# Patient Record
Sex: Male | Born: 1955
Health system: Southern US, Community
[De-identification: ages and names within clinical notes are randomized; demographics above are authoritative.]

## PROBLEM LIST (undated history)

## (undated) DIAGNOSIS — R739 Hyperglycemia, unspecified: Secondary | ICD-10-CM

## (undated) DIAGNOSIS — R631 Polydipsia: Secondary | ICD-10-CM

## (undated) DIAGNOSIS — J449 Chronic obstructive pulmonary disease, unspecified: Secondary | ICD-10-CM

## (undated) DIAGNOSIS — R3589 Other polyuria: Secondary | ICD-10-CM

## (undated) DIAGNOSIS — R079 Chest pain, unspecified: Secondary | ICD-10-CM

## (undated) DIAGNOSIS — G8929 Other chronic pain: Secondary | ICD-10-CM

## (undated) DIAGNOSIS — R1110 Vomiting, unspecified: Secondary | ICD-10-CM

## (undated) DIAGNOSIS — R519 Headache, unspecified: Secondary | ICD-10-CM

## (undated) DIAGNOSIS — R51 Headache: Secondary | ICD-10-CM

## (undated) DIAGNOSIS — E119 Type 2 diabetes mellitus without complications: Secondary | ICD-10-CM

## (undated) DIAGNOSIS — R358 Other polyuria: Secondary | ICD-10-CM

## (undated) DIAGNOSIS — R42 Dizziness and giddiness: Secondary | ICD-10-CM

## (undated) HISTORY — DX: Headache, unspecified: R51.9

## (undated) HISTORY — DX: Chronic obstructive pulmonary disease, unspecified: J44.9

## (undated) HISTORY — DX: Vomiting, unspecified: R11.10

## (undated) HISTORY — DX: Other polyuria: R35.8

## (undated) HISTORY — DX: Other chronic pain: G89.29

## (undated) HISTORY — DX: Polydipsia: R63.1

## (undated) HISTORY — DX: Hyperglycemia, unspecified: R73.9

## (undated) HISTORY — DX: Headache: R51

## (undated) HISTORY — DX: Dizziness and giddiness: R42

## (undated) HISTORY — DX: Other polyuria: R35.89

## (undated) HISTORY — DX: Chest pain, unspecified: R07.9

## (undated) HISTORY — DX: Type 2 diabetes mellitus without complications: E11.9

---

## 2015-01-29 ENCOUNTER — Encounter (HOSPITAL_COMMUNITY): Payer: Self-pay | Admitting: Emergency Medicine

## 2015-01-29 ENCOUNTER — Emergency Department (HOSPITAL_COMMUNITY): Payer: Self-pay

## 2015-01-29 ENCOUNTER — Emergency Department (HOSPITAL_COMMUNITY)
Admission: EM | Admit: 2015-01-29 | Discharge: 2015-01-29 | Disposition: A | Discharge status: Home / Self Care | Payer: Self-pay | Attending: Emergency Medicine | Admitting: Emergency Medicine

## 2015-01-29 DIAGNOSIS — R1110 Vomiting, unspecified: Secondary | ICD-10-CM | POA: Insufficient documentation

## 2015-01-29 DIAGNOSIS — J40 Bronchitis, not specified as acute or chronic: Secondary | ICD-10-CM | POA: Insufficient documentation

## 2015-01-29 DIAGNOSIS — Z720 Tobacco use: Secondary | ICD-10-CM | POA: Insufficient documentation

## 2015-01-29 LAB — BASIC METABOLIC PANEL
Anion gap: 6 (ref 5–15)
BUN: 10 mg/dL (ref 6–20)
CHLORIDE: 107 mmol/L (ref 101–111)
CO2: 25 mmol/L (ref 22–32)
Calcium: 8.9 mg/dL (ref 8.9–10.3)
Creatinine, Ser: 0.92 mg/dL (ref 0.61–1.24)
GFR calc Af Amer: >60 mL/min (ref >60)
GFR calc non Af Amer: >60 mL/min (ref >60)
Glucose, Bld: 126 mg/dL — ABNORMAL HIGH (ref 70–99)
Potassium: 4.6 mmol/L (ref 3.5–5.1)
SODIUM: 138 mmol/L (ref 135–145)

## 2015-01-29 LAB — CBC
HCT: 41 % (ref 39.0–52.0)
Hemoglobin: 13.5 g/dL (ref 13.0–17.0)
MCH: 32.1 pg (ref 26.0–34.0)
MCHC: 32.9 g/dL (ref 30.0–36.0)
MCV: 97.4 fL (ref 78.0–100.0)
Platelets: 260 10*3/uL (ref 150–400)
RBC: 4.21 MIL/uL — AB (ref 4.22–5.81)
RDW: 12 % (ref 11.5–15.5)
WBC: 8.7 10*3/uL (ref 4.0–10.5)

## 2015-01-29 LAB — TROPONIN I

## 2015-01-29 LAB — BRAIN NATRIURETIC PEPTIDE: B NATRIURETIC PEPTIDE 5: 10.5 pg/mL (ref 0.0–100.0)

## 2015-01-29 MED ORDER — ALBUTEROL SULFATE HFA 108 (90 BASE) MCG/ACT IN AERS
2.0000 | INHALATION_SPRAY | Freq: Once | RESPIRATORY_TRACT | Status: AC
  Administered 2015-01-29: 2 via RESPIRATORY_TRACT
  Filled 2015-01-29: qty 6.7

## 2015-01-29 MED ORDER — ALBUTEROL (5 MG/ML) CONTINUOUS INHALATION SOLN
15.0000 mg/h | INHALATION_SOLUTION | Freq: Once | RESPIRATORY_TRACT | Status: AC
  Administered 2015-01-29: 15 mg/h via RESPIRATORY_TRACT
  Filled 2015-01-29: qty 20

## 2015-01-29 MED ORDER — PREDNISONE 20 MG PO TABS
60.0000 mg | ORAL_TABLET | Freq: Once | ORAL | Status: AC
  Administered 2015-01-29: 60 mg via ORAL
  Filled 2015-01-29: qty 3

## 2015-01-29 MED ORDER — IPRATROPIUM BROMIDE 0.02 % IN SOLN
1.0000 mg | Freq: Once | RESPIRATORY_TRACT | Status: AC
  Administered 2015-01-29: 1 mg via RESPIRATORY_TRACT
  Filled 2015-01-29: qty 5

## 2015-01-29 MED ORDER — ALBUTEROL SULFATE HFA 108 (90 BASE) MCG/ACT IN AERS: 2.0000 | INHALATION_SPRAY | RESPIRATORY_TRACT | Status: DC | PRN

## 2015-01-29 MED ORDER — PREDNISONE 20 MG PO TABS: 60.0000 mg | ORAL_TABLET | Freq: Every day | ORAL | Status: AC

## 2015-01-29 NOTE — ED Notes (Signed)
MD at bedside. 

## 2015-01-29 NOTE — ED Provider Notes (Signed)
CSN: [id number]     Arrival date & time 01/29/15  [id number] History   First MD Initiated Contact with Patient 01/29/15 1826     Chief Complaint  Patient presents with  . Chest Pain  . Shortness of Breath  . Cough     (Consider location/radiation/quality/duration/timing/severity/associated sxs/prior Treatment) HPI  59 year old male presents with chest pain, shortness breath, and cough over the last 2 days. It is a dry cough. Had a temperature of 100 yesterday. The patient's pain is constant and feels like a heaviness. Patient gets short of breath with minimal exertion. Patient chronically smokes daily but has not been able to smoke at all today due to shortness of breath. No known medical problems but patient has not been to a doctor in several years. The patient has never tried an albuterol inhaler or been told he has lung problems. One time had posttussive emesis. No leg swelling.  History reviewed. No pertinent past medical history. History reviewed. No pertinent past surgical history. History reviewed. No pertinent family history. History  Substance Use Topics  . Smoking status: Current Every Day Smoker  . Smokeless tobacco: Not on file  . Alcohol Use: No    Review of Systems  Constitutional: Positive for fever.  HENT: Positive for congestion (chest congestion).   Respiratory: Positive for cough and shortness of breath.   Cardiovascular: Positive for chest pain. Negative for leg swelling.  Gastrointestinal: Positive for vomiting.  All other systems reviewed and are negative.     Allergies  Review of patient's allergies indicates no known allergies.  Home Medications   Prior to Admission medications   Not on File   BP 153/95 mmHg  Pulse 75  Temp(Src) 98.7 F (37.1 C) (Oral)  Resp 26  SpO2 94% Physical Exam  Constitutional: He is oriented to person, place, and time. He appears well-developed and well-nourished. No distress.  HENT:  Head: Normocephalic and atraumatic.   Right Ear: External ear normal.  Left Ear: External ear normal.  Nose: Nose normal.  Eyes: Right eye exhibits no discharge. Left eye exhibits no discharge.  Neck: Neck supple.  Cardiovascular: Normal rate, regular rhythm, normal heart sounds and intact distal pulses.   Pulmonary/Chest: Effort normal. He has decreased breath sounds. He has wheezes. He exhibits tenderness.  Abdominal: Soft. He exhibits no distension. There is no tenderness.  Musculoskeletal: He exhibits no edema or tenderness.  Neurological: He is alert and oriented to person, place, and time.  Skin: Skin is warm and dry. He is not diaphoretic.  Nursing note and vitals reviewed.   ED Course  Procedures (including critical care time) Labs Review Labs Reviewed  CBC - Abnormal; Notable for the following:    RBC 4.21 (*)    All other components within normal limits  BASIC METABOLIC PANEL - Abnormal; Notable for the following:    Glucose, Bld 126 (*)    All other components within normal limits  BRAIN NATRIURETIC PEPTIDE  TROPONIN I    Imaging Review Dg Chest 2 View  01/29/2015   CLINICAL DATA:  Chest pain dyspnea and cough for 2 days.  EXAM: CHEST  2 VIEW  COMPARISON:  None.  FINDINGS: The heart size and mediastinal contours are within normal limits. Both lungs are clear. The visualized skeletal structures are unremarkable.  IMPRESSION: No active cardiopulmonary disease.   Electronically Signed   By: Ellery Plunkaniel R Mitchell M.D.   On: 01/29/2015 19:01     EKG Interpretation   Date/Time:  Sunday Jan 29 2015 18:29:55 EDT Ventricular Rate:  78 PR Interval:  142 QRS Duration: 101 QT Interval:  363 QTC Calculation: 413 R Axis:   79 Text Interpretation:  Sinus rhythm Baseline wander in lead(s) III V1 No  old tracing to compare Confirmed by JACUBOWITZ  MD, SAM [phone]) on  01/29/2015 6:33:41 PM      MDM   Final diagnoses:  Bronchitis    Patient was given a continuous albuterol nebulization given his significantly  decreased breath sounds and expiratory wheezes. I believe patient likely has undiagnosed COPD given his chronic smoking history. Chest x-ray is unremarkable. No hypoxia. After the albuterol and steroids the patient feels much better and has clear lungs. He was ambulated and his pulse ox remained above 93%. Minimal dyspnea while walking. Believe the patient is stable for discharge, he was given an inhaler in the ER and instructed on its use. Will also discharge with a steroid burst and make referral to a PCP in pulmonology for formal lung function testing.    Mark LovelessScott Odester Nilson, MD 01/30/15 0001

## 2015-01-29 NOTE — ED Notes (Signed)
RT notified that EDP wants continuous neb after xray has been completed

## 2015-01-29 NOTE — ED Notes (Signed)
Pt speaks spanish.  Grandson states that pt has been having gen chest pain, SOB, and cough x 2 days.  Some post tussive emesis.

## 2016-08-29 ENCOUNTER — Emergency Department (HOSPITAL_COMMUNITY): Payer: Self-pay

## 2016-08-29 ENCOUNTER — Encounter (HOSPITAL_COMMUNITY): Payer: Self-pay | Admitting: Emergency Medicine

## 2016-08-29 ENCOUNTER — Emergency Department (HOSPITAL_COMMUNITY)
Admission: EM | Admit: 2016-08-29 | Discharge: 2016-08-30 | Disposition: A | Discharge status: Home / Self Care | Payer: Self-pay | Attending: Emergency Medicine | Admitting: Emergency Medicine

## 2016-08-29 DIAGNOSIS — R55 Syncope and collapse: Secondary | ICD-10-CM | POA: Insufficient documentation

## 2016-08-29 DIAGNOSIS — F17200 Nicotine dependence, unspecified, uncomplicated: Secondary | ICD-10-CM | POA: Insufficient documentation

## 2016-08-29 DIAGNOSIS — R911 Solitary pulmonary nodule: Secondary | ICD-10-CM | POA: Insufficient documentation

## 2016-08-29 DIAGNOSIS — R059 Cough, unspecified: Secondary | ICD-10-CM

## 2016-08-29 DIAGNOSIS — R062 Wheezing: Secondary | ICD-10-CM | POA: Insufficient documentation

## 2016-08-29 DIAGNOSIS — D649 Anemia, unspecified: Secondary | ICD-10-CM | POA: Insufficient documentation

## 2016-08-29 DIAGNOSIS — R7309 Other abnormal glucose: Secondary | ICD-10-CM | POA: Insufficient documentation

## 2016-08-29 DIAGNOSIS — Z79899 Other long term (current) drug therapy: Secondary | ICD-10-CM | POA: Insufficient documentation

## 2016-08-29 DIAGNOSIS — R05 Cough: Secondary | ICD-10-CM | POA: Insufficient documentation

## 2016-08-29 LAB — URINALYSIS, ROUTINE W REFLEX MICROSCOPIC
Bilirubin Urine: NEGATIVE
Glucose, UA: NEGATIVE mg/dL
Hgb urine dipstick: NEGATIVE
Ketones, ur: NEGATIVE mg/dL
LEUKOCYTES UA: NEGATIVE
Nitrite: NEGATIVE
Protein, ur: NEGATIVE mg/dL
SPECIFIC GRAVITY, URINE: 1.017 (ref 1.005–1.030)
pH: 6 (ref 5.0–8.0)

## 2016-08-29 LAB — CBC
HCT: 37.1 % — ABNORMAL LOW (ref 39.0–52.0)
Hemoglobin: 12.3 g/dL — ABNORMAL LOW (ref 13.0–17.0)
MCH: 32.1 pg (ref 26.0–34.0)
MCHC: 33.2 g/dL (ref 30.0–36.0)
MCV: 96.9 fL (ref 78.0–100.0)
Platelets: 261 10*3/uL (ref 150–400)
RBC: 3.83 MIL/uL — AB (ref 4.22–5.81)
RDW: 12.5 % (ref 11.5–15.5)
WBC: 10.4 10*3/uL (ref 4.0–10.5)

## 2016-08-29 LAB — BRAIN NATRIURETIC PEPTIDE: B NATRIURETIC PEPTIDE 5: 12.8 pg/mL (ref 0.0–100.0)

## 2016-08-29 LAB — BASIC METABOLIC PANEL
Anion gap: 7 (ref 5–15)
BUN: 13 mg/dL (ref 6–20)
CALCIUM: 9.2 mg/dL (ref 8.9–10.3)
CO2: 27 mmol/L (ref 22–32)
Chloride: 105 mmol/L (ref 101–111)
Creatinine, Ser: 0.78 mg/dL (ref 0.61–1.24)
GFR calc non Af Amer: >60 mL/min (ref >60)
Glucose, Bld: 190 mg/dL — ABNORMAL HIGH (ref 65–99)
POTASSIUM: 3.9 mmol/L (ref 3.5–5.1)
Sodium: 139 mmol/L (ref 135–145)

## 2016-08-29 LAB — CBG MONITORING, ED: Glucose-Capillary: 167 mg/dL — ABNORMAL HIGH (ref 65–99)

## 2016-08-29 LAB — I-STAT TROPONIN, ED: TROPONIN I, POC: 0 ng/mL (ref 0.00–0.08)

## 2016-08-29 MED ORDER — IOPAMIDOL (ISOVUE-370) INJECTION 76%
INTRAVENOUS | Status: AC
  Filled 2016-08-29: qty 100

## 2016-08-29 MED ORDER — IOPAMIDOL (ISOVUE-370) INJECTION 76%
100.0000 mL | Freq: Once | INTRAVENOUS | Status: AC | PRN
Start: 2016-08-29 — End: 2016-08-29
  Administered 2016-08-29: 100 mL via INTRAVENOUS

## 2016-08-29 MED ORDER — IPRATROPIUM BROMIDE 0.02 % IN SOLN
0.5000 mg | Freq: Once | RESPIRATORY_TRACT | Status: AC
  Administered 2016-08-29: 0.5 mg via RESPIRATORY_TRACT
  Filled 2016-08-29: qty 2.5

## 2016-08-29 MED ORDER — ALBUTEROL SULFATE (2.5 MG/3ML) 0.083% IN NEBU
5.0000 mg | INHALATION_SOLUTION | Freq: Once | RESPIRATORY_TRACT | Status: AC
  Administered 2016-08-29: 5 mg via RESPIRATORY_TRACT
  Filled 2016-08-29: qty 6

## 2016-08-29 MED ORDER — SODIUM CHLORIDE 0.9 % IJ SOLN
INTRAMUSCULAR | Status: AC
  Filled 2016-08-29: qty 50

## 2016-08-29 NOTE — ED Provider Notes (Signed)
[facility]-EMERGENCY DEPT Provider Note    By signing my name below, I, Mark Curtis, attest that this documentation has been prepared under the direction and in the presence of [employer], PA-C. Electronically Signed: Earmon Curtis, ED Scribe. 08/30/16. 1:02 AM.    History   Chief Complaint Chief Complaint  Patient presents with  . Cough  . Loss of Consciousness   The history is provided by the patient and medical records. No language interpreter was used.    HPI Comments:  Mark Curtis is a 60 y.o. male who presents to the Emergency Department complaining of a progressively worsening productive cough of greenish sputum that began 8 days ago. He reports associated worsening SOB, chest congestion, chest tightness, intermittent palpitations and two post tussive syncopal events over the past two days when he ambulated to use the bathroom. He reports right elbow pain that began two days ago and posterior neck pain that began yesterday secondary to hitting these areas after the falls. He reports taking Mucinex, Motrin and an unspecified OTC cough medication with no significant relief of the symptoms. Exertion increases the SOB. He denies alleviating factors. He denies fever, chills, nausea, vomiting, numbness, tingling or weakness of any extremity, lower extremity swelling, CP. He reports being a smoker. 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 He denies any recent surgeries. He states he recently traveled to [place] in the past couple weeks but no other long distance travel. He denies h/o PE/DVT or cancer.   History reviewed. No pertinent past medical history.  There are no active problems to display for this patient.   History reviewed. No pertinent surgical history.     Home Medications    Prior to Admission medications   Medication Sig Start Date End Date Taking? Authorizing Provider    acetaminophen (TYLENOL) 500 MG tablet Take 500 mg by mouth every 6 (six) hours as needed for mild pain or moderate pain.   Yes Historical Provider, MD  albuterol (PROVENTIL HFA;VENTOLIN HFA) 108 (90 BASE) MCG/ACT inhaler Inhale 2 puffs into the lungs every 4 (four) hours as needed for wheezing or shortness of breath. 01/29/15  Yes Pricilla Loveless, MD  guaiFENesin (MUCINEX) 600 MG 12 hr tablet Take 600 mg by mouth 2 (two) times daily as needed for cough or to loosen phlegm.   Yes Historical Provider, MD  albuterol (PROVENTIL HFA;VENTOLIN HFA) 108 (90 Base) MCG/ACT inhaler Inhale 2 puffs into the lungs every 4 (four) hours as needed for wheezing or shortness of breath. 08/30/16   Charlesa Ehle, PA-C  azithromycin (ZITHROMAX) 250 MG tablet Take 1 tablet (250 mg total) by mouth daily. Take first 2 tablets together, then 1 every day until finished. 08/30/16   Julia Alkhatib, PA-C  predniSONE (DELTASONE) 20 MG tablet Take 2 tablets (40 mg total) by mouth daily. 08/30/16   Dahlia Client Trebor Galdamez, PA-C    Family History History reviewed. No pertinent family history.  Social History Social History  Substance Use Topics  . Smoking status: Current Every Day Smoker  . Smokeless tobacco: Not on file  . Alcohol use No     Allergies   Patient has no known allergies.   Review of Systems Review of Systems  Constitutional: Negative for chills and fever.  Respiratory: Positive for cough, chest tightness and shortness of breath.   Cardiovascular: Positive for palpitations. Negative for chest pain and leg swelling.  Gastrointestinal: Negative for nausea and vomiting.  Neurological: Positive  for syncope. Negative for weakness and numbness.  All other systems reviewed and are negative.    Physical Exam Updated Vital Signs BP 145/80 (BP Location: Left Arm)   Pulse 81   Resp 18   SpO2 98%   Physical Exam  Constitutional: He appears well-developed and well-nourished. No distress.  Awake, alert,  nontoxic appearance  HENT:  Head: Normocephalic and atraumatic.  Right Ear: Tympanic membrane, external ear and ear canal normal.  Left Ear: Tympanic membrane, external ear and ear canal normal.  Nose: Mucosal edema and rhinorrhea present. No epistaxis. Right sinus exhibits no maxillary sinus tenderness and no frontal sinus tenderness. Left sinus exhibits no maxillary sinus tenderness and no frontal sinus tenderness.  Mouth/Throat: Uvula is midline, oropharynx is clear and moist and mucous membranes are normal. Mucous membranes are not pale and not cyanotic. No oropharyngeal exudate, posterior oropharyngeal edema, posterior oropharyngeal erythema or tonsillar abscesses.  Eyes: Conjunctivae are normal. Pupils are equal, round, and reactive to light. No scleral icterus.  Neck: Normal range of motion and full passive range of motion without pain. Neck supple.  Midline cervical and paraspinal tenderness. Full ROM of cervical spine.  Cardiovascular: Normal rate, regular rhythm, normal heart sounds and intact distal pulses.   No murmur heard. Pulmonary/Chest: Accessory muscle usage ( mild) present. No stridor. Tachypnea noted. He has decreased breath sounds.  Diminished and equal breath sounds without focal wheezes, rhonchi, rales.  Abdominal: Soft. Bowel sounds are normal. He exhibits no mass. There is no tenderness. There is no rebound and no guarding.  Musculoskeletal: Normal range of motion. He exhibits tenderness. He exhibits no edema or deformity.  Full ROM of right elbow. Tenderness to palpation over the medial and lateral epicondyles of the right elbow. No midline thoracic or lumbar tenderness. No lower extremity edema.  Lymphadenopathy:    He has no cervical adenopathy.  Neurological: He is alert. No cranial nerve deficit.  Mental Status:  Alert, oriented, thought content appropriate, able to give a coherent history. Speech fluent without evidence of aphasia. Able to follow 2 step commands  without difficulty.  Cranial Nerves:  II:  Peripheral visual fields grossly normal, pupils equal, round, reactive to light III,IV, VI: ptosis not present, extra-ocular motions intact bilaterally  V,VII: smile symmetric, facial light touch sensation equal VIII: hearing grossly normal to voice  X: uvula elevates symmetrically  XI: bilateral shoulder shrug symmetric and strong XII: midline tongue extension without fassiculations Motor:  Normal tone. 5/5 in upper and lower extremities bilaterally including strong and equal grip strength and dorsiflexion/plantar flexion Sensory: Pinprick and light touch normal in all extremities.  Deep Tendon Reflexes: 2+ and symmetric in the biceps and patella Cerebellar: normal finger-to-nose with bilateral upper extremities Gait: normal gait and balance CV: distal pulses palpable throughout   Skin: Skin is warm and dry. No rash noted. He is not diaphoretic.  Psychiatric: He has a normal mood and affect.  Nursing note and vitals reviewed.    ED Treatments / Results   DIAGNOSTIC STUDIES: Oxygen Saturation is 98% on RA, normal by my interpretation.   COORDINATION OF CARE: 9:25 PM- Will order labs and CT of head, neck and chest. Will X-Ray right elbow. He reports some relief from the albuterol nebulizer he was given prior to exam. Will order another nebulizer treatment. Pt verbalizes understanding and agrees to plan.  Medications  sodium chloride 0.9 % injection (not administered)  iopamidol (ISOVUE-370) 76 % injection (not administered)  albuterol (PROVENTIL HFA;VENTOLIN HFA) 108 (  90 Base) MCG/ACT inhaler (  Not Given 08/30/16 0131)  albuterol (PROVENTIL) (2.5 MG/3ML) 0.083% nebulizer solution 5 mg (5 mg Nebulization Given 08/29/16 1833)  albuterol (PROVENTIL) (2.5 MG/3ML) 0.083% nebulizer solution 5 mg (5 mg Nebulization Given 08/29/16 2152)  ipratropium (ATROVENT) nebulizer solution 0.5 mg (0.5 mg Nebulization Given 08/29/16 2152)  iopamidol  (ISOVUE-370) 76 % injection 100 mL (100 mLs Intravenous Contrast Given 08/29/16 2307)  albuterol (PROVENTIL) (2.5 MG/3ML) 0.083% nebulizer solution 5 mg (5 mg Nebulization Given 08/30/16 0106)  ipratropium (ATROVENT) nebulizer solution 0.5 mg (0.5 mg Nebulization Given 08/30/16 0106)  albuterol (PROVENTIL HFA;VENTOLIN HFA) 108 (90 Base) MCG/ACT inhaler 2 puff (2 puffs Inhalation Given 08/30/16 0132)  methylPREDNISolone sodium succinate (SOLU-MEDROL) 125 mg/2 mL injection 125 mg (125 mg Intravenous Given 08/30/16 0151)   Labs (all labs ordered are listed, but only abnormal results are displayed) Labs Reviewed  BASIC METABOLIC PANEL - Abnormal; Notable for the following:       Result Value   Glucose, Bld 190 (*)    All other components within normal limits  CBC - Abnormal; Notable for the following:    RBC 3.83 (*)    Hemoglobin 12.3 (*)    HCT 37.1 (*)    All other components within normal limits  CBG MONITORING, ED - Abnormal; Notable for the following:    Glucose-Capillary 167 (*)    All other components within normal limits  URINALYSIS, ROUTINE W REFLEX MICROSCOPIC  BRAIN NATRIURETIC PEPTIDE  I-STAT TROPOININ, ED    EKG  EKG Interpretation  Date/Time:  Thursday August 29 2016 18:33:03 EST Ventricular Rate:  75 PR Interval:    QRS Duration: 103 QT Interval:  380 QTC Calculation: 425 R Axis:   83 Text Interpretation:  Sinus rhythm No significant change since last tracing Confirmed by [facility], PEDRO ([id number]) on 08/29/2016 11:48:23 PM       Radiology Dg Chest 2 View  Result Date: 08/29/2016 CLINICAL DATA:  60 year old male progressive cough congestion wheezing for 1 week. Syncope last night. Initial encounter. EXAM: CHEST  2 VIEW COMPARISON:  01/29/2015 chest radiographs. FINDINGS: Larger lung volumes but still within normal limits. Normal cardiac size and mediastinal contours. Visualized tracheal air column is within normal limits. No pneumothorax, pulmonary edema, pleural  effusion or confluent pulmonary opacity. No acute osseous abnormality identified. Negative visible bowel gas pattern. IMPRESSION: No acute cardiopulmonary abnormality. Electronically Signed   By: Odessa FlemingH  Hall M.D.   On: 08/29/2016 19:04   Ct Head Wo Contrast  Result Date: 08/29/2016 CLINICAL DATA:  Progressive cough and congestion with wheezing. Coughing and cough syncopal episodes x1 week. EXAM: CT HEAD WITHOUT CONTRAST CT CERVICAL SPINE WITHOUT CONTRAST TECHNIQUE: Multidetector CT imaging of the head and cervical spine was performed following the standard protocol without intravenous contrast. Multiplanar CT image reconstructions of the cervical spine were also generated. COMPARISON:  None. FINDINGS: CT HEAD FINDINGS Brain: No evidence of acute infarction, hemorrhage, hydrocephalus, extra-axial collection or mass lesion/mass effect. Vascular: No hyperdense vessel or unexpected calcification. Skull: Normal. Negative for fracture or focal lesion. Sinuses/Orbits: There is moderate ethmoid sinus mucosal thickening polypoid mucosal thickening in the maxillary sinuses. No air-fluid levels are noted. Mild circumferential mucosal thickening of the sphenoid sinus. The frontal sinus appears clear. Visualized orbits demonstrate no acute abnormality. Other: None. CT CERVICAL SPINE FINDINGS Alignment: Craniocervical relationship is maintained. 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 There is slight reversal cervical lordosis which may be due  to spasm or positioning. Skull base and vertebrae: No acute fracture of the vertebral bodies for visualized skull base. Patchy due to degenerative subcortical lucencies are noted about the vertebral bodies from C2 through C6. Soft tissues and spinal canal: No prevertebral soft tissue swelling is noted. No intraspinal hematoma or significant spinal stenosis. Disc levels: There is degenerative disc space narrowing with small  posterior marginal osteophytes at C5-6 contributing to mild bilateral neural foraminal encroachment. The facets are maintained and aligned bilaterally. There is mild facet hypertrophy on the left at C4-5. Upper chest: Negative Other: None IMPRESSION: Chronic appearing and ethmoid, sphenoid and maxillary sinusitis. No acute intracranial abnormality. Degenerative disc disease most prominently affecting C5-6 with bilateral mild neural foraminal encroachment. The Electronically Signed   By: Tollie Ethavid  Kwon M.D.   On: 08/29/2016 23:53   Ct Angio Chest Pe W Or Wo Contrast  Result Date: 08/30/2016 CLINICAL DATA:  Fall, syncope. Cough and shortness of breath. Dyspnea on exertion. EXAM: CT ANGIOGRAPHY CHEST WITH CONTRAST TECHNIQUE: Multidetector CT imaging of the chest was performed using the standard protocol during bolus administration of intravenous contrast. Multiplanar CT image reconstructions and MIPs were obtained to evaluate the vascular anatomy. CONTRAST:  100 cc Isovue 370 IV COMPARISON:  Radiographs earlier this day FINDINGS: Cardiovascular: There are no filling defects within the pulmonary arteries to suggest pulmonary embolus. No aortic dissection. Minimal atherosclerosis of the aortic arch. Normal heart size. No pericardial fluid. Mediastinum/Nodes: Prominent right hilar node measures 10 mm short axis. There are small lower paratracheal lymph nodes. No axillary adenopathy. Visualized thyroid gland is normal. Esophagus mildly patulous. 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 Lungs/Pleura: Mild motion artifact through the lower lobes. Tiny 4 mm nodule in the right middle lobe image 57 series 14. Linear subsegmental atelectasis in the lingula. No findings of pulmonary edema or focal airspace disease. No pleural fluid. Upper Abdomen: Hepatic steatosis suspected.  No acute abnormality. Musculoskeletal: There are no acute or suspicious osseous abnormalities. Bone island in mid thoracic vertebra.  Review of the MIP images confirms the above findings. IMPRESSION: 1. No pulmonary embolus. 2. Prominent right hilar lymph node may be reactive but is nonspecific. 3. Tiny 4 mm right middle lobe nodule. No follow-up needed if patient is low-risk. Non-contrast chest CT can be considered in 12 months if patient is high-risk. This recommendation follows the consensus statement: Guidelines for Management of Incidental Pulmonary Nodules Detected on CT Images: From the Fleischner Society 2017; Radiology 2017; 284:228-243. Electronically Signed   By: Rubye OaksMelanie  Ehinger M.D.   On: 08/30/2016 00:00   Ct Cervical Spine Wo Contrast  Result Date: 08/29/2016 CLINICAL DATA:  Progressive cough and congestion with wheezing. Coughing and cough syncopal episodes x1 week. EXAM: CT HEAD WITHOUT CONTRAST CT CERVICAL SPINE WITHOUT CONTRAST TECHNIQUE: Multidetector CT imaging of the head and cervical spine was performed following the standard protocol without intravenous contrast. Multiplanar CT image reconstructions of the cervical spine were also generated. COMPARISON:  None. FINDINGS: CT HEAD FINDINGS Brain: No evidence of acute infarction, hemorrhage, hydrocephalus, extra-axial collection or mass lesion/mass effect. Vascular: No hyperdense vessel or unexpected calcification. Skull: Normal. Negative for fracture or focal lesion. Sinuses/Orbits: There is moderate ethmoid sinus mucosal thickening polypoid mucosal thickening in the maxillary sinuses. No air-fluid levels are noted. Mild circumferential mucosal thickening of the sphenoid sinus. The frontal sinus appears clear. Visualized orbits demonstrate no acute abnormality. Other: None. CT CERVICAL SPINE FINDINGS Alignment: Craniocervical relationship is maintained. There is osteoarthritis about the  atlantodental interval with joint space narrowing, spurring and sclerosis. There is slight reversal cervical lordosis which may be due to spasm or positioning. Skull base and vertebrae: No  acute fracture of the vertebral bodies for visualized skull base. Patchy due to degenerative subcortical lucencies are noted about the vertebral bodies from C2 through C6. Soft tissues and spinal canal: No prevertebral soft tissue swelling is noted. No intraspinal hematoma or significant spinal stenosis. Disc levels: There is degenerative disc space narrowing with small posterior marginal osteophytes at C5-6 contributing to mild bilateral neural foraminal encroachment. The facets are maintained and aligned bilaterally. There is mild facet hypertrophy on the left at C4-5. Upper chest: Negative Other: None IMPRESSION: Chronic appearing and ethmoid, sphenoid and maxillary sinusitis. No acute intracranial abnormality. Degenerative disc disease most prominently affecting C5-6 with bilateral mild neural foraminal encroachment. The Electronically Signed   By: Tollie Eth M.D.   On: 08/29/2016 23:53    Procedures Procedures (including critical care time)  Medications Ordered in ED Medications  sodium chloride 0.9 % injection (not administered)  iopamidol (ISOVUE-370) 76 % injection (not administered)  albuterol (PROVENTIL HFA;VENTOLIN HFA) 108 (90 Base) MCG/ACT inhaler (  Not Given 08/30/16 0131)  albuterol (PROVENTIL) (2.5 MG/3ML) 0.083% nebulizer solution 5 mg (5 mg Nebulization Given 08/29/16 1833)  albuterol (PROVENTIL) (2.5 MG/3ML) 0.083% nebulizer solution 5 mg (5 mg Nebulization Given 08/29/16 2152)  ipratropium (ATROVENT) nebulizer solution 0.5 mg (0.5 mg Nebulization Given 08/29/16 2152)  iopamidol (ISOVUE-370) 76 % injection 100 mL (100 mLs Intravenous Contrast Given 08/29/16 2307)  albuterol (PROVENTIL) (2.5 MG/3ML) 0.083% nebulizer solution 5 mg (5 mg Nebulization Given 08/30/16 0106)  ipratropium (ATROVENT) nebulizer solution 0.5 mg (0.5 mg Nebulization Given 08/30/16 0106)  albuterol (PROVENTIL HFA;VENTOLIN HFA) 108 (90 Base) MCG/ACT inhaler 2 puff (2 puffs Inhalation Given 08/30/16 0132)    methylPREDNISolone sodium succinate (SOLU-MEDROL) 125 mg/2 mL injection 125 mg (125 mg Intravenous Given 08/30/16 0151)     Initial Impression / Assessment and Plan / ED Course  I have reviewed the triage vital signs and the nursing notes.  Pertinent labs & imaging results that were available during my care of the patient were reviewed by me and considered in my medical decision making (see chart for details).  Clinical Course    Patient presents with 8 days of shortness of breath with dyspnea on exertion. No evidence of heart failure. EKG with normal sinus rhythm, troponin negative and BNP within normal limits. Urinalysis without evidence of infection.  Elevated glucose noted however patient ate shortly before arrival. Mild anemia noted.  CT without evidence of pneumonia or pulmonary embolism. Lung nodule is noted. On discussion with patient and family about need for follow-up for lung nodule. Patient was given 3 albuterol treatments along with Solu-Medrol with significant improvement in his respiratory status. His tachypnea resolved. No additional accessory muscle usage or distress. He ambulates without hypoxia or dyspnea. Patient is a long-term smoker and suspect some component of COPD. Will discharge home with symptomatic treatment.  Discussed with patient and family the importance of close PCP follow-up for mild anemia, slightly elevated glucose and lung nodule. Patient given resources for wellness Center. Patient discharged with albuterol MDI.  Patient without syncope in the emergency department. No arrhythmia noted. History is consistent with vasovagal syncope secondary to severe bouts of coughing.  Neurologic exam is nonfocal.  History and physical are not consistent with TIA. Doubt cardiac arrhythmia. Patient is to return to the emergency department if syncope returns.  I personally performed the services described in this documentation, which was scribed in my presence. The recorded  information has been reviewed and is accurate.   Final Clinical Impressions(s) / ED Diagnoses   Final diagnoses:  Cough  Wheezing  Pulmonary nodule  Syncope, unspecified syncope type  Anemia, unspecified type  Elevated glucose    New Prescriptions Discharge Medication List as of 08/30/2016  1:36 AM    START taking these medications   Details  azithromycin (ZITHROMAX) 250 MG tablet Take 1 tablet (250 mg total) by mouth daily. Take first 2 tablets together, then 1 every day until finished., Starting Fri 08/30/2016, Print    predniSONE (DELTASONE) 20 MG tablet Take 2 tablets (40 mg total) by mouth daily., Starting Fri 08/30/2016, Print         Stafford Riviera, PA-C 08/30/16 0214    Nira Conn, MD 08/30/16 [phone]

## 2016-08-29 NOTE — ED Triage Notes (Signed)
Pt c/o severe progressive cough and congestion, wheezing, coughing has caused syncopal episodes x 1 week. Self-treating with OTC mucinex without relief. Last syncopal episode last night.

## 2016-08-29 NOTE — ED Notes (Signed)
Pt informed urine needed for UA unable to provide at this time. Will check back.

## 2016-08-30 MED ORDER — PREDNISONE 20 MG PO TABS: 40.0000 mg | ORAL_TABLET | Freq: Every day | ORAL | 0 refills | Status: DC

## 2016-08-30 MED ORDER — ALBUTEROL SULFATE HFA 108 (90 BASE) MCG/ACT IN AERS
INHALATION_SPRAY | RESPIRATORY_TRACT | Status: AC
  Filled 2016-08-30: qty 6.7

## 2016-08-30 MED ORDER — METHYLPREDNISOLONE SODIUM SUCC 125 MG IJ SOLR
125.0000 mg | Freq: Once | INTRAMUSCULAR | Status: AC
  Administered 2016-08-30: 125 mg via INTRAVENOUS
  Filled 2016-08-30: qty 2

## 2016-08-30 MED ORDER — ALBUTEROL SULFATE HFA 108 (90 BASE) MCG/ACT IN AERS: 2.0000 | INHALATION_SPRAY | RESPIRATORY_TRACT | 3 refills | Status: DC | PRN

## 2016-08-30 MED ORDER — IPRATROPIUM BROMIDE 0.02 % IN SOLN
0.5000 mg | Freq: Once | RESPIRATORY_TRACT | Status: AC
  Administered 2016-08-30: 0.5 mg via RESPIRATORY_TRACT
  Filled 2016-08-30: qty 2.5

## 2016-08-30 MED ORDER — ALBUTEROL SULFATE (2.5 MG/3ML) 0.083% IN NEBU
5.0000 mg | INHALATION_SOLUTION | Freq: Once | RESPIRATORY_TRACT | Status: AC
  Administered 2016-08-30: 5 mg via RESPIRATORY_TRACT
  Filled 2016-08-30: qty 6

## 2016-08-30 MED ORDER — ALBUTEROL SULFATE HFA 108 (90 BASE) MCG/ACT IN AERS
2.0000 | INHALATION_SPRAY | Freq: Once | RESPIRATORY_TRACT | Status: AC
  Administered 2016-08-30: 2 via RESPIRATORY_TRACT

## 2016-08-30 MED ORDER — AZITHROMYCIN 250 MG PO TABS: 250.0000 mg | ORAL_TABLET | Freq: Every day | ORAL | 0 refills | Status: DC

## 2016-08-30 NOTE — ED Notes (Signed)
Ambulated patient and he did well. O2 ranged from 94-97.

## 2016-08-30 NOTE — Discharge Instructions (Signed)
1. Medications: albuterol, prednisone, azithromycin, usual home medications °2. Treatment: rest, drink plenty of fluids, begin OTC antihistamine (Zyrtec or Claritin)  °3. Follow Up: Please followup with your primary doctor in 2-3 days for discussion of your diagnoses and further evaluation after today's visit; if you do not have a primary care doctor use the resource guide provided to find one; Please return to the ER for difficulty breathing, high fevers or worsening symptoms. ° °

## 2017-06-24 ENCOUNTER — Emergency Department (HOSPITAL_COMMUNITY): Payer: Self-pay

## 2017-06-24 ENCOUNTER — Emergency Department (HOSPITAL_COMMUNITY)
Admission: EM | Admit: 2017-06-24 | Discharge: 2017-06-24 | Disposition: A | Discharge status: Home / Self Care | Payer: Self-pay | Attending: Emergency Medicine | Admitting: Emergency Medicine

## 2017-06-24 ENCOUNTER — Encounter (HOSPITAL_COMMUNITY): Payer: Self-pay | Admitting: Emergency Medicine

## 2017-06-24 DIAGNOSIS — R079 Chest pain, unspecified: Secondary | ICD-10-CM | POA: Insufficient documentation

## 2017-06-24 DIAGNOSIS — Z87891 Personal history of nicotine dependence: Secondary | ICD-10-CM | POA: Insufficient documentation

## 2017-06-24 DIAGNOSIS — R739 Hyperglycemia, unspecified: Secondary | ICD-10-CM | POA: Insufficient documentation

## 2017-06-24 DIAGNOSIS — Z7982 Long term (current) use of aspirin: Secondary | ICD-10-CM | POA: Insufficient documentation

## 2017-06-24 DIAGNOSIS — E119 Type 2 diabetes mellitus without complications: Secondary | ICD-10-CM | POA: Insufficient documentation

## 2017-06-24 DIAGNOSIS — Z7984 Long term (current) use of oral hypoglycemic drugs: Secondary | ICD-10-CM | POA: Insufficient documentation

## 2017-06-24 DIAGNOSIS — R631 Polydipsia: Secondary | ICD-10-CM | POA: Insufficient documentation

## 2017-06-24 LAB — BLOOD GAS, VENOUS
Acid-Base Excess: 2.5 mmol/L — ABNORMAL HIGH (ref 0.0–2.0)
BICARBONATE: 28.2 mmol/L — AB (ref 20.0–28.0)
O2 Saturation: 67.7 %
PATIENT TEMPERATURE: 98.6
PH VEN: 7.372 (ref 7.250–7.430)
pCO2, Ven: 49.8 mmHg (ref 44.0–60.0)
pO2, Ven: 37.5 mmHg (ref 32.0–45.0)

## 2017-06-24 LAB — BASIC METABOLIC PANEL
Anion gap: 10 (ref 5–15)
BUN: 11 mg/dL (ref 6–20)
CHLORIDE: 99 mmol/L — AB (ref 101–111)
CO2: 26 mmol/L (ref 22–32)
CREATININE: 0.78 mg/dL (ref 0.61–1.24)
Calcium: 9.5 mg/dL (ref 8.9–10.3)
GFR calc Af Amer: >60 mL/min (ref >60)
GFR calc non Af Amer: >60 mL/min (ref >60)
Glucose, Bld: 417 mg/dL — ABNORMAL HIGH (ref 65–99)
Potassium: 4.3 mmol/L (ref 3.5–5.1)
Sodium: 135 mmol/L (ref 135–145)

## 2017-06-24 LAB — URINALYSIS, ROUTINE W REFLEX MICROSCOPIC
Bacteria, UA: NONE SEEN
Bilirubin Urine: NEGATIVE
Glucose, UA: >=500 mg/dL — AB
Hgb urine dipstick: NEGATIVE
Ketones, ur: 5 mg/dL — AB
Leukocytes, UA: NEGATIVE
Nitrite: NEGATIVE
PH: 6 (ref 5.0–8.0)
Protein, ur: NEGATIVE mg/dL
SQUAMOUS EPITHELIAL / LPF: NONE SEEN
Specific Gravity, Urine: 1.033 — ABNORMAL HIGH (ref 1.005–1.030)

## 2017-06-24 LAB — CBC
HCT: 44 % (ref 39.0–52.0)
Hemoglobin: 15.5 g/dL (ref 13.0–17.0)
MCH: 32.5 pg (ref 26.0–34.0)
MCHC: 35.2 g/dL (ref 30.0–36.0)
MCV: 92.2 fL (ref 78.0–100.0)
PLATELETS: 201 10*3/uL (ref 150–400)
RBC: 4.77 MIL/uL (ref 4.22–5.81)
RDW: 11.8 % (ref 11.5–15.5)
WBC: 10.1 10*3/uL (ref 4.0–10.5)

## 2017-06-24 LAB — CBG MONITORING, ED
GLUCOSE-CAPILLARY: 276 mg/dL — AB (ref 65–99)
Glucose-Capillary: 424 mg/dL — ABNORMAL HIGH (ref 65–99)
Glucose-Capillary: 291 mg/dL — ABNORMAL HIGH (ref 65–99)

## 2017-06-24 LAB — TROPONIN I

## 2017-06-24 MED ORDER — METFORMIN HCL 500 MG PO TABS: 500.0000 mg | ORAL_TABLET | Freq: Two times a day (BID) | ORAL | 0 refills | Status: DC

## 2017-06-24 MED ORDER — SODIUM CHLORIDE 0.9 % IV BOLUS (SEPSIS)
1000.0000 mL | Freq: Once | INTRAVENOUS | Status: AC
Start: 2017-06-24 — End: 2017-06-24
  Administered 2017-06-24: 1000 mL via INTRAVENOUS

## 2017-06-24 MED ORDER — SODIUM CHLORIDE 0.9 % IV BOLUS (SEPSIS)
1000.0000 mL | Freq: Once | INTRAVENOUS | Status: AC
  Administered 2017-06-24: 1000 mL via INTRAVENOUS

## 2017-06-24 MED ORDER — METFORMIN HCL 500 MG PO TABS
500.0000 mg | ORAL_TABLET | Freq: Once | ORAL | Status: AC
  Administered 2017-06-24: 500 mg via ORAL
  Filled 2017-06-24: qty 1

## 2017-06-24 MED ORDER — ASPIRIN EC 81 MG PO TBEC: 81.0000 mg | DELAYED_RELEASE_TABLET | Freq: Every day | ORAL | 0 refills | Status: DC

## 2017-06-24 NOTE — ED Triage Notes (Signed)
Patient brought in by son, speaks no [place]. Son reports that patient has been complaining of increase urination and excessive thirst over the past few months. Dizziness. CBG 464 in triage.

## 2017-06-24 NOTE — ED Provider Notes (Signed)
[facility]-EMERGENCY DEPT Provider Note   CSN: [id number] Arrival date & time: 06/24/17  1028     History   Chief Complaint Chief Complaint  Patient presents with  . Polyuria  . Polydipsia    HPI Mark Curtis is a 61 y.o. male.  HPI  61 year old male who smokes but denies any other past medical history presents with polyuria and polydipsia. History is taken without but his son who interprets. This is been going on for at least a week or 2 or he has had dry mouth and frequent thirst. He has been drinking a lot. He's been urinating a lot. Over the past 2 mornings he's vomited once each in the morning. No reported history of diabetes. The patient has also had some chest pain when asked on review of systems. It feels a Squeezing. This is also been for the past 2 weeks, mostly when exerting himself at work or in the heat. He gets a headache at the same time. None of the symptoms are present currently.  History reviewed. No pertinent past medical history.  There are no active problems to display for this patient.   History reviewed. No pertinent surgical history.     Home Medications    Prior to Admission medications   Medication Sig Start Date End Date Taking? Authorizing Provider  albuterol (PROVENTIL HFA;VENTOLIN HFA) 108 (90 Base) MCG/ACT inhaler Inhale 2 puffs into the lungs every 4 (four) hours as needed for wheezing or shortness of breath. 08/30/16  Yes Muthersbaugh, Boyd Kerbs  aspirin EC 81 MG tablet Take 1 tablet (81 mg total) by mouth daily. 06/24/17   Pricilla Loveless, MD  metFORMIN (GLUCOPHAGE) 500 MG tablet Take 1 tablet (500 mg total) by mouth 2 (two) times daily with a meal. 06/24/17   Pricilla Loveless, MD    Family History No family history on file.  Social History Social History  Substance Use Topics  . Smoking status: Current Every Day Smoker  . Smokeless tobacco: Never Used  . Alcohol use No     Allergies   Patient has no known allergies.   Review of  Systems Review of Systems  Constitutional: Negative for fever.  Cardiovascular: Positive for chest pain.  Gastrointestinal: Positive for vomiting. Negative for abdominal pain.  Endocrine: Positive for polydipsia and polyuria.  Neurological: Positive for dizziness (when standing) and headaches. Negative for weakness and numbness.  All other systems reviewed and are negative.    Physical Exam Updated Vital Signs BP (!) 118/93 (BP Location: Right Arm)   Pulse 71   Temp 98.5 F (36.9 C)   Resp 16   SpO2 99%   Physical Exam  Constitutional: He is oriented to person, place, and time. He appears well-developed and well-nourished. No distress.  HENT:  Head: Normocephalic and atraumatic.  Right Ear: External ear normal.  Left Ear: External ear normal.  Nose: Nose normal.  Mouth/Throat: Oropharynx is clear and moist. No oropharyngeal exudate.  Eyes: Pupils are equal, round, and reactive to light. EOM are normal. Right eye exhibits no discharge. Left eye exhibits no discharge.  Neck: Normal range of motion. Neck supple.  Cardiovascular: Normal rate, regular rhythm and normal heart sounds.   Pulmonary/Chest: Effort normal and breath sounds normal.  Abdominal: Soft. There is no tenderness.  Musculoskeletal: He exhibits no edema.  Neurological: He is alert and oriented to person, place, and time.  CN 3-12 grossly intact. 5/5 strength in all 4 extremities. Grossly normal sensation. Normal finger to nose.  Skin: Skin is warm and dry. He is not diaphoretic.  Nursing note and vitals reviewed.    ED Treatments / Results  Labs (all labs ordered are listed, but only abnormal results are displayed) Labs Reviewed  BASIC METABOLIC PANEL - Abnormal; Notable for the following:       Result Value   Chloride 99 (*)    Glucose, Bld 417 (*)    All other components within normal limits  URINALYSIS, ROUTINE W REFLEX MICROSCOPIC - Abnormal; Notable for the following:    Specific Gravity, Urine  1.033 (*)    Glucose, UA >=500 (*)    Ketones, ur 5 (*)    All other components within normal limits  BLOOD GAS, VENOUS - Abnormal; Notable for the following:    Bicarbonate 28.2 (*)    Acid-Base Excess 2.5 (*)    All other components within normal limits  CBG MONITORING, ED - Abnormal; Notable for the following:    Glucose-Capillary 424 (*)    All other components within normal limits  CBG MONITORING, ED - Abnormal; Notable for the following:    Glucose-Capillary 276 (*)    All other components within normal limits  CBG MONITORING, ED - Abnormal; Notable for the following:    Glucose-Capillary 291 (*)    All other components within normal limits  CBC  TROPONIN I    EKG  EKG Interpretation  Date/Time:  Tuesday June 24 2017 11:55:22 EDT Ventricular Rate:  74 PR Interval:    QRS Duration: 100 QT Interval:  378 QTC Calculation: 420 R Axis:   86 Text Interpretation:  Sinus rhythm Borderline right axis deviation Low voltage, precordial leads Abnormal inferior Q waves no significant change since Dec 2017 Confirmed by Pricilla Loveless [phone]) on 06/24/2017 12:08:00 PM       Radiology Dg Chest 2 View  Result Date: 06/24/2017 CLINICAL DATA:  Chest pain EXAM: CHEST  2 VIEW COMPARISON:  08/29/2016 FINDINGS: Cardiac shadows within normal limits. The lungs are well aerated bilaterally. No focal infiltrate or sizable effusion is seen. No acute bony abnormality is noted. IMPRESSION: No active cardiopulmonary disease. Electronically Signed   By: Alcide Clever M.D.   On: 06/24/2017 12:29    Procedures Procedures (including critical care time)  Medications Ordered in ED Medications  sodium chloride 0.9 % bolus 1,000 mL (0 mLs Intravenous Stopped 06/24/17 1355)  sodium chloride 0.9 % bolus 1,000 mL (0 mLs Intravenous Stopped 06/24/17 1355)  metFORMIN (GLUCOPHAGE) tablet 500 mg (500 mg Oral Given 06/24/17 1356)     Initial Impression / Assessment and Plan / ED Course  I have reviewed  the triage vital signs and the nursing notes.  Pertinent labs & imaging results that were available during my care of the patient were reviewed by me and considered in my medical decision making (see chart for details).     Patient's workup is consistent with hyperglycemia from new onset diabetes. He does have chest pain on and off, mostly with exertion for the past 2 weeks. However no chest pain now. ECG similar to prior with no ischemic changes. Troponin negative. This could be anginal, I will refer him to cardiology and ask him to start a baby aspirin. As for his diabetes, he was given IV fluids and feels much better. Started on metformin. I have discussed the importance of a PCP and diet/exercise changes. He and family seem to understand. Discussed return precautions.  Final Clinical Impressions(s) / ED Diagnoses   Final diagnoses:  New onset type 2 diabetes mellitus (HCC)  Hyperglycemia  Chest pain on exertion    New Prescriptions Discharge Medication List as of 06/24/2017  2:09 PM    START taking these medications   Details  aspirin EC 81 MG tablet Take 1 tablet (81 mg total) by mouth daily., Starting Tue 06/24/2017, Print    metFORMIN (GLUCOPHAGE) 500 MG tablet Take 1 tablet (500 mg total) by mouth 2 (two) times daily with a meal., Starting Tue 06/24/2017, Print         Pricilla Loveless, MD 06/24/17 [phone]

## 2017-07-11 ENCOUNTER — Ambulatory Visit: Payer: Self-pay | Attending: Internal Medicine | Admitting: Physician Assistant

## 2017-07-11 VITALS — BP 128/82 | HR 75 | Temp 97.6°F | Resp 18 | Ht 71.0 in | Wt 194.4 lb

## 2017-07-11 DIAGNOSIS — E138 Other specified diabetes mellitus with unspecified complications: Secondary | ICD-10-CM

## 2017-07-11 DIAGNOSIS — R358 Other polyuria: Secondary | ICD-10-CM | POA: Insufficient documentation

## 2017-07-11 DIAGNOSIS — Z7982 Long term (current) use of aspirin: Secondary | ICD-10-CM | POA: Insufficient documentation

## 2017-07-11 DIAGNOSIS — R079 Chest pain, unspecified: Secondary | ICD-10-CM | POA: Insufficient documentation

## 2017-07-11 DIAGNOSIS — Z7984 Long term (current) use of oral hypoglycemic drugs: Secondary | ICD-10-CM | POA: Insufficient documentation

## 2017-07-11 DIAGNOSIS — F17210 Nicotine dependence, cigarettes, uncomplicated: Secondary | ICD-10-CM | POA: Insufficient documentation

## 2017-07-11 DIAGNOSIS — F17200 Nicotine dependence, unspecified, uncomplicated: Secondary | ICD-10-CM

## 2017-07-11 DIAGNOSIS — E119 Type 2 diabetes mellitus without complications: Secondary | ICD-10-CM | POA: Insufficient documentation

## 2017-07-11 DIAGNOSIS — Z79899 Other long term (current) drug therapy: Secondary | ICD-10-CM | POA: Insufficient documentation

## 2017-07-11 LAB — POCT GLYCOSYLATED HEMOGLOBIN (HGB A1C): Hemoglobin A1C: 12.7

## 2017-07-11 LAB — GLUCOSE, POCT (MANUAL RESULT ENTRY): POC GLUCOSE: 312 mg/dL — AB (ref 70–99)

## 2017-07-11 MED ORDER — METFORMIN HCL 1000 MG PO TABS: 1000.0000 mg | ORAL_TABLET | Freq: Two times a day (BID) | ORAL | 3 refills | Status: DC

## 2017-07-11 MED FILL — ?METFORMIN HCL 1,000 MG TAB: 1000 | 30 days supply | Qty: 60 | Fill #0

## 2017-07-11 NOTE — Patient Instructions (Addendum)
Aim for 30 minutes of exercise most days. Rethink what you drink. Water is great! Aim for 2-3 Carb Choices per meal (30-45 grams) +/- 1 either way  Aim for 0-15 Carbs per snack if hungry  Include protein in moderation with your meals and snacks  Consider reading food labels for Total Carbohydrate and Fat Grams of foods  Consider checking BG at alternate times per day  Continue taking medication as directed Be mindful about how much sugar you are adding to beverages and other foods. Fruit Punch - find one with no sugar  Measure and decrease portions of carbohydrate foods  Make your plate and don't go back for seconds   Check you sugar 4 times per day and bring the numbers in to next visit  Stop smoking!!

## 2017-07-11 NOTE — Progress Notes (Signed)
Mark Schultzablo [name]  [id number]  [id number]:[id number]  DOB - 24-Oct-1955  Chief Complaint  Patient presents with  . Diabetes       Subjective:   Mark Curtis is a 61 y.o. male here today for establishment of care. He has a past medical history of smoking and recently was diagnosed with diabetes mellitus type 2. He presented to the emergency department on 06/24/2017 with polyuria, polydipsia, dry mouth and vomiting for several weeks. His blood sugar was found to be 417. His vital signs were stable. His ketones were minimal. Is an ion gap was normal. His creatinine was normal. He was given IV fluids with improvement of his blood sugar to the 270s. He was started on metformin 500 mg twice daily. He has a blood sugar meter at home.  He also had complained of some chest pain. He described a squeezing-like sensation in the anterior chest with sometimes radiation into the neck. Sometimes is exertional. 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 An EKG showed sinus rhythm with nonspecific ST-T wave changes inferiorly laterally. His troponin was negative. A chest x-ray showed no acute process. He was given aspirin 81 mg daily.  He states that since discharge she's been compliant with the medications. He does check his sugar at home but has still been elevated between 3 and 400s. He's been taking his aspirin. He continues to smoke three fourths pack per day of cigarettes. Less chest pain than before.   ROS: GEN: denies fever or chills, denies change in weight Skin: denies lesions or rashes HEENT: denies headache, earache, epistaxis, sore throat, or neck pain LUNGS: denies SHOB, dyspnea, PND, orthopnea CV: + CP no palpitations ABD: denies abd pain, N or V EXT: denies muscle spasms or swelling; no pain in lower ext, no weakness NEURO: denies numbness or tingling, denies sz, stroke or TIA   ALLERGIES: No Known Allergies  PAST MEDICAL HISTORY: Past Medical History:  Diagnosis  Date  . Chest pain   . Chronic headaches   . DM (diabetes mellitus) (HCC)   . Hyperglycemia   . Polydipsia   . Polyuria   . Spell of dizziness    WHEN STANDING  . Vomiting     PAST SURGICAL HISTORY: No past surgical history on file.  MEDICATIONS AT HOME: Prior to Admission medications   Medication Sig Start Date End Date Taking? Authorizing Provider  albuterol (PROVENTIL HFA;VENTOLIN HFA) 108 (90 Base) MCG/ACT inhaler Inhale 2 puffs into the lungs every 4 (four) hours as needed for wheezing or shortness of breath. 08/30/16  Yes Muthersbaugh, Boyd KerbsHannah, PA-C  aspirin EC 81 MG tablet Take 1 tablet (81 mg total) by mouth daily. 06/24/17  Yes Pricilla LovelessGoldston, Scott, MD  metFORMIN (GLUCOPHAGE) 500 MG tablet Take 1 tablet (500 mg total) by mouth 2 (two) times daily with a meal. 06/24/17  Yes Pricilla LovelessGoldston, Scott, MD   Family- no prem CAD Social-from [place], smokes 3/4 ppd cigs, works [employer] representative[employer]  Objective:   Vitals:   07/11/17 0930  BP: 128/82  Pulse: 75  Resp: 18  Temp: 97.6 F (36.4 C)  TempSrc: Oral  SpO2: 96%  Weight: 194 lb 6.4 oz (88.2 kg)  Height: 5\' 11"  (1.803 m)    Exam General appearance : Awake, alert, not in any distress. Speech Clear. Not toxic looking HEENT: Atraumatic and Normocephalic, pupils equally reactive to light and accomodation Neck: supple, no JVD. No cervical lymphadenopathy.  Chest:Good air entry bilaterally, no added sounds  CVS:  S1 S2 regular, no murmurs.  Abdomen: Bowel sounds present, Non tender and not distended with no guarding, rigidity or rebound. Extremities: B/L Lower Ext shows no edema, both legs are warm to touch Neurology: Awake alert, and oriented X 3, CN II-XII intact, Non focal Skin:No Rash Wounds:N/A  Data Review Lab Results  Component Value Date   HGBA1C 12.7 07/11/2017     Assessment & Plan  1. DM 2  -education provided Aim for 30 minutes of exercise most days. Rethink what you drink. Water is great! Aim for 2-3 Carb Choices  per meal (30-45 grams) +/- 1 either way  Aim for 0-15 Carbs per snack if hungry  Include protein in moderation with your meals and snacks  Consider reading food labels for Total Carbohydrate and Fat Grams of foods  Consider checking BG at alternate times per day  Continue taking medication as directed Be mindful about how much sugar you are adding to beverages and other foods. Fruit Punch - find one with no sugar  Measure and decrease portions of carbohydrate foods  Make your plate and don't go back for seconds   -Inc Metformin 100 mg BID  -Keep a log and bring to next appt, may need insulin at some point 2. Smoker  -cessation discussed   3. CP  -cont ASA 81 mg daily  -CARDS referral for stress testing  -RF modification, smoking cessation and check lipids at annual visit   Return in about 2 weeks (around 07/25/2017).  The patient was given clear instructions to go to ER or return to medical center if symptoms don't improve, worsen or new problems develop. The patient verbalized understanding. The patient was told to call to get lab results if they haven't heard anything in the next week.   Total time spent with patient was 33 min. Greater than 50 % of this visit was spent face to face counseling and coordinating care regarding risk factor modification, compliance importance and encouragement, education related to diabetes and CP.  This note has been created with Education officer, environmental. Any transcriptional errors are unintentional.    Scot Jun, PA-C [facility] [place], [place] [phone]   07/11/2017, 10:02 AM

## 2017-07-15 NOTE — Progress Notes (Deleted)
Cardiology Office Note    Date:  07/15/2017   ID:  Mark Curtis, DOB [birth year], MRN [id number]  PCP:  Patient, No Pcp Per  Cardiologist:  New to   Chief Complaint: Chest pain   History of Present Illness:   Mark Curtis is a 61 y.o. male with recent dx of DM and going tobacco smoking referred by ER provider Dr. Criss AlvineGoldston for evaluation of chest pain.   Seem in ER 06/24/17 for polyuria and polydipsia. Also complained of squeezing chest tightness. EKG without acute change.    Past Medical History:  Diagnosis Date  . Chest pain   . Chronic headaches   . DM (diabetes mellitus) (HCC)   . Hyperglycemia   . Polydipsia   . Polyuria   . Spell of dizziness    WHEN STANDING  . Vomiting     No past surgical history on file.  Current Medications: Prior to Admission medications   Medication Sig Start Date End Date Taking? Authorizing Provider  albuterol (PROVENTIL HFA;VENTOLIN HFA) 108 (90 Base) MCG/ACT inhaler Inhale 2 puffs into the lungs every 4 (four) hours as needed for wheezing or shortness of breath. 08/30/16   Muthersbaugh, Dahlia ClientHannah, PA-C  aspirin EC 81 MG tablet Take 1 tablet (81 mg total) by mouth daily. 06/24/17   Pricilla LovelessGoldston, Scott, MD  metFORMIN (GLUCOPHAGE) 1000 MG tablet Take 1 tablet (1,000 mg total) by mouth 2 (two) times daily with a meal. 07/11/17   Vivianne MasterNoel, Tiffany S, PA-C    Allergies:   Patient has no known allergies.   Social History   Social History  . Marital status: Married    Spouse name: N/A  . Number of children: N/A  . Years of education: N/A   Social History Main Topics  . Smoking status: Current Every Day Smoker  . Smokeless tobacco: Never Used  . Alcohol use No  . Drug use: No  . Sexual activity: Not on file   Other Topics Concern  . Not on file   Social History Narrative  . No narrative on file     Family History:  The patient's family history is not on file. ***  ROS:   Please see the history of present illness.    ROS All other systems  reviewed and are negative.   PHYSICAL EXAM:   VS:  There were no vitals taken for this visit.   GEN: Well nourished, well developed, in no acute distress  HEENT: normal  Neck: no JVD, carotid bruits, or masses Cardiac: ***RRR; no murmurs, rubs, or gallops,no edema  Respiratory:  clear to auscultation bilaterally, normal work of breathing GI: soft, nontender, nondistended, + BS MS: no deformity or atrophy  Skin: warm and dry, no rash Neuro:  Alert and Oriented x 3, Strength and sensation are intact Psych: euthymic mood, full affect  Wt Readings from Last 3 Encounters:  07/11/17 194 lb 6.4 oz (88.2 kg)      Studies/Labs Reviewed:   EKG:  EKG is ordered today.  The ekg ordered today demonstrates ***  Recent Labs: 08/29/2016: B Natriuretic Peptide 12.8 06/24/2017: BUN 11; Creatinine, Ser 0.78; Hemoglobin 15.5; Platelets 201; Potassium 4.3; Sodium 135   Lipid Panel No results found for: CHOL, TRIG, HDL, CHOLHDL, VLDL, LDLCALC, LDLDIRECT  Additional studies/ records that were reviewed today include:   Echocardiogram:  Cardiac Catheterization:     ASSESSMENT & PLAN:    1. ***    Medication Adjustments/Labs and Tests Ordered: Current medicines are reviewed at  length with the patient today.  Concerns regarding medicines are outlined above.  Medication changes, Labs and Tests ordered today are listed in the Patient Instructions below. There are no Patient Instructions on file for this visit.   Lorelei Pont, [place]  07/15/2017 3:54 PM    [facility] [address] [place], [place], [place]  [address] Phone: [phone]; Fax: [phone]

## 2017-07-17 ENCOUNTER — Ambulatory Visit: Payer: Self-pay | Admitting: Physician Assistant

## 2017-08-07 NOTE — Progress Notes (Signed)
Cardiology Office Note    Date:  08/11/2017   ID:  Mark Curtis, DOB 10/21/1955, MRN [id number]  PCP:  Patient, No Pcp Per  Cardiologist: New to Dr. Graciela Husbands  Chief Complaint: Chest pain   History of Present Illness:   Mark Curtis is a 61 y.o. male with hx of tobacco smoking and new dx of DM referred by Scot Jun, [facility] for chest pain.  Seen in ER 06/24/17 for polyuria, polydipsia, dry mouth and vomiting for several weeks. His blood sugar was elevated. Given fluids and started on metformin. He had chest pain radiating to back. EKG sinus rhythm with non specific changes. Started on ASA.   Seen in [facility] 07/11/17 to establish care. HgbA1c 12.7.   Here today for follow up. Patient had a syncope episode approximately 6 months ago in [name]. He was walking in hotel at that time. He works in [employer].  No prodrome. LOC of approximately 1-2 minutes less than 30 sed . No reoccurrence.  Witnessed by Co-Worker.  Patient complains of exertional chest tightness, shortness of breath and palpitation.  This has been worsening occurring with ever less exertion .  He smokes half a pack to 1 pack of cigarettes for the past 30 years.  He denies orthopnea, PND, lower extremity edema.  Intermittent dizziness and blurry vision.  Both occurs at different times.  Denies family history of stroke or MI.  History is difficult due to language barrier.  We used language line.   Past Medical History:  Diagnosis Date  . Chest pain   . Chronic headaches   . DM (diabetes mellitus) (HCC)   . Hyperglycemia   . Polydipsia   . Polyuria   . Spell of dizziness    WHEN STANDING  . Vomiting     History reviewed. No pertinent surgical history.  Current Medications: Prior to Admission medications   Medication Sig Start Date End Date Taking? Authorizing Provider  albuterol (PROVENTIL HFA;VENTOLIN HFA) 108 (90 Base) MCG/ACT inhaler Inhale 2 puffs into the lungs every 4 (four) hours as needed for wheezing or  shortness of breath. 08/30/16   Muthersbaugh, Dahlia Client, PA-C  aspirin EC 81 MG tablet Take 1 tablet (81 mg total) by mouth daily. 06/24/17   Pricilla Loveless, MD  metFORMIN (GLUCOPHAGE) 1000 MG tablet Take 1 tablet (1,000 mg total) by mouth 2 (two) times daily with a meal. 07/11/17   [name], PA-C    Allergies:   Patient has no known allergies.   Social History   Socioeconomic History  . Marital status: Married    Spouse name: None  . Number of children: None  . Years of education: None  . Highest education level: None  Social Needs  . Financial resource strain: None  . Food insecurity - worry: None  . Food insecurity - inability: None  . Transportation needs - medical: None  . Transportation needs - non-medical: None  Occupational History  . None  Tobacco Use  . Smoking status: Current Every Day Smoker  . Smokeless tobacco: Never Used  Substance and Sexual Activity  . Alcohol use: No  . Drug use: No  . Sexual activity: None  Other Topics Concern  . None  Social History Narrative  . None     Family History:  The patient's family history includes Diabetes in his father.  Intermittent dizziness and blurred vision. ROS:   Please see the history of present illness.    ROS All other systems reviewed and  are negative.   PHYSICAL EXAM:   VS:  BP 104/76   Pulse 76   Ht 5\' 11"  (1.803 m)   Wt 194 lb 1.9 oz (88.1 kg)   SpO2 96%   BMI 27.07 kg/m    GEN: Well nourished, well developed, in no acute distress  HEENT: normal  Neck: no JVD, carotid bruits, or masses Cardiac: RRR; no murmurs, rubs, or gallops,no edema  Respiratory:  clear to auscultation bilaterally, normal work of breathing GI: soft, nontender, nondistended, + BS MS: no deformity or atrophy  Skin: warm and dry, no rash Neuro:  Alert and Oriented x 3, Strength and sensation are intact Psych: euthymic mood, full affect  Wt Readings from Last 3 Encounters:  08/11/17 194 lb 1.9 oz (88.1 kg)  07/11/17 194  lb 6.4 oz (88.2 kg)      Studies/Labs Reviewed:   EKG:  EKG is not ordered today.  EKG 06/24/2017 shows normal sinus rhythm with inferior Q waves.  Recent Labs: 08/29/2016: B Natriuretic Peptide 12.8 06/24/2017: BUN 11; Creatinine, Ser 0.78; Hemoglobin 15.5; Platelets 201; Potassium 4.3; Sodium 135   Lipid Panel No results found for: CHOL, TRIG, HDL, CHOLHDL, VLDL, LDLCALC, LDLDIRECT  Additional studies/ records that were reviewed today include:  As above   ASSESSMENT & PLAN:    1. Chest pain -His symptoms is concerning for angina.  Plan is to proceed with coronary angiography for definitive evaluation.  Initially he aggred however at the time of check out he decided not to do it for personal and financial reason. Educated regarding option of assistant program in hospital. I have spent > 30 minutes discussing importance of need to cath. Discussed risk of sudden cardiac death or MI. Patient is willing to take it. Advised to go to ER if uncontrolled pain.  - Continue aspirin 81 mg.  Start Lipitor 40 qd. PRN  Nitro.   2. DM -Per PCP.  3. Tobacco smoking -Encourage smoking sensation.  4. Pulmonary nodule on CT angio of chest on 08/2016 - Per PCP  5.  Syncope/palpitation -6 months ago.  No evaluation.  No recurrence.  Rule out ischemic etiology  first.  May need monitor for intermittent palpitation if no cause found.  6. Dizziness/blured vision -Not orthostatic by vitals today.  Advised to get eye exam given recent diagnosis of untreated diabetes.  Orthostatic VS for the past 24 hrs:  BP- Lying Pulse- Lying BP- Sitting Pulse- Sitting BP- Standing at 0 minutes Pulse- Standing at 0 minutes  08/11/17 0930 121/81 74 121/81 80 111/80 83     Medication Adjustments/Labs and Tests Ordered: Current medicines are reviewed at length with the patient today.  Concerns regarding medicines are outlined above.  Medication changes, Labs and Tests ordered today are listed in the Patient  Instructions below. Patient Instructions  Medication Instructions:  START- Atorvastatin 40 mg daily Continue Asprin 81 mg daily  If you need a refill on your cardiac medications before your next appointment, please call your pharmacy.  Labwork: Fasting Lipids Today HERE IN OUR OFFICE AT [facility]  Testing/Procedures: Your physician has requested that you have an echocardiogram. Echocardiography is a painless test that uses sound waves to create images of your heart. It provides your doctor with information about the size and shape of your heart and how well your heart's chambers and valves are working. This procedure takes approximately one hour. There are no restrictions for this procedure.  Follow-Up: Your physician wants you to follow-up in: 4 weeks  with Dr Marikay AlarKlien.   Thank you for choosing [facility]!!         Signed, Dayne Dekay, GeorgiaPA  08/11/2017 12:13 PM    [facility] [address], [place], [place]  [address] Phone: [phone]; Fax: [phone]    Seen and interviewed separately via interpreter Note modified to clarify findings  Recommend echo for LV function in the context of syncope Angiography for high pretest likelihood of CAD and typical angina

## 2017-08-11 ENCOUNTER — Encounter: Payer: Self-pay | Admitting: Physician Assistant

## 2017-08-11 ENCOUNTER — Ambulatory Visit (INDEPENDENT_AMBULATORY_CARE_PROVIDER_SITE_OTHER): Payer: Self-pay | Admitting: Physician Assistant

## 2017-08-11 VITALS — BP 104/76 | HR 76 | Ht 71.0 in | Wt 194.1 lb

## 2017-08-11 DIAGNOSIS — R55 Syncope and collapse: Secondary | ICD-10-CM

## 2017-08-11 DIAGNOSIS — Z720 Tobacco use: Secondary | ICD-10-CM

## 2017-08-11 DIAGNOSIS — Z01812 Encounter for preprocedural laboratory examination: Secondary | ICD-10-CM

## 2017-08-11 DIAGNOSIS — E119 Type 2 diabetes mellitus without complications: Secondary | ICD-10-CM

## 2017-08-11 DIAGNOSIS — I200 Unstable angina: Secondary | ICD-10-CM

## 2017-08-11 DIAGNOSIS — E785 Hyperlipidemia, unspecified: Secondary | ICD-10-CM

## 2017-08-11 DIAGNOSIS — R42 Dizziness and giddiness: Secondary | ICD-10-CM

## 2017-08-11 LAB — LIPID PANEL
CHOL/HDL RATIO: 6.4 ratio — AB (ref 0.0–5.0)
Cholesterol, Total: 204 mg/dL — ABNORMAL HIGH (ref 100–199)
HDL: 32 mg/dL — ABNORMAL LOW (ref >39)
LDL Calculated: 132 mg/dL — ABNORMAL HIGH (ref 0–99)
Triglycerides: 200 mg/dL — ABNORMAL HIGH (ref 0–149)
VLDL Cholesterol Cal: 40 mg/dL (ref 5–40)

## 2017-08-11 MED ORDER — ATORVASTATIN CALCIUM 40 MG PO TABS: 40.0000 mg | ORAL_TABLET | Freq: Every day | ORAL | 3 refills | Status: DC

## 2017-08-11 NOTE — Patient Instructions (Addendum)
Medication Instructions:  START- Atorvastatin 40 mg daily Continue Asprin 81 mg daily  If you need a refill on your cardiac medications before your next appointment, please call your pharmacy.  Labwork: Fasting Lipids Today HERE IN OUR OFFICE AT [facility]  Testing/Procedures: Your physician has requested that you have an echocardiogram. Echocardiography is a painless test that uses sound waves to create images of your heart. It provides your doctor with information about the size and shape of your heart and how well your heart's chambers and valves are working. This procedure takes approximately one hour. There are no restrictions for this procedure.  Follow-Up: Your physician wants you to follow-up in: 4 weeks with Dr Marikay AlarKlien.   Thank you for choosing [facility]

## 2017-08-13 ENCOUNTER — Encounter: Payer: Self-pay | Admitting: Family Medicine

## 2017-08-13 ENCOUNTER — Ambulatory Visit (HOSPITAL_COMMUNITY): Payer: Self-pay | Attending: Internal Medicine

## 2017-08-13 ENCOUNTER — Ambulatory Visit: Payer: Self-pay | Attending: Family Medicine | Admitting: Family Medicine

## 2017-08-13 ENCOUNTER — Telehealth: Payer: Self-pay | Admitting: Family Medicine

## 2017-08-13 ENCOUNTER — Other Ambulatory Visit: Payer: Self-pay

## 2017-08-13 VITALS — BP 111/70 | HR 89 | Temp 97.7°F | Resp 18 | Ht 71.0 in | Wt 195.4 lb

## 2017-08-13 DIAGNOSIS — H538 Other visual disturbances: Secondary | ICD-10-CM | POA: Insufficient documentation

## 2017-08-13 DIAGNOSIS — Z7982 Long term (current) use of aspirin: Secondary | ICD-10-CM | POA: Insufficient documentation

## 2017-08-13 DIAGNOSIS — E1165 Type 2 diabetes mellitus with hyperglycemia: Secondary | ICD-10-CM | POA: Insufficient documentation

## 2017-08-13 DIAGNOSIS — I200 Unstable angina: Secondary | ICD-10-CM

## 2017-08-13 DIAGNOSIS — I253 Aneurysm of heart: Secondary | ICD-10-CM | POA: Insufficient documentation

## 2017-08-13 DIAGNOSIS — E138 Other specified diabetes mellitus with unspecified complications: Secondary | ICD-10-CM

## 2017-08-13 DIAGNOSIS — Z7984 Long term (current) use of oral hypoglycemic drugs: Secondary | ICD-10-CM | POA: Insufficient documentation

## 2017-08-13 DIAGNOSIS — I5030 Unspecified diastolic (congestive) heart failure: Secondary | ICD-10-CM | POA: Insufficient documentation

## 2017-08-13 DIAGNOSIS — Z79899 Other long term (current) drug therapy: Secondary | ICD-10-CM | POA: Insufficient documentation

## 2017-08-13 DIAGNOSIS — Z760 Encounter for issue of repeat prescription: Secondary | ICD-10-CM | POA: Insufficient documentation

## 2017-08-13 DIAGNOSIS — Z1211 Encounter for screening for malignant neoplasm of colon: Secondary | ICD-10-CM | POA: Insufficient documentation

## 2017-08-13 LAB — GLUCOSE, POCT (MANUAL RESULT ENTRY): POC GLUCOSE: 253 mg/dL — AB (ref 70–99)

## 2017-08-13 MED ORDER — TRUEPLUS LANCETS 28G MISC: 1.0000 | Freq: Once | 12 refills | Status: AC

## 2017-08-13 MED ORDER — TRUE METRIX METER W/DEVICE KIT: 1.0000 | PACK | Freq: Once | 0 refills | Status: AC

## 2017-08-13 MED ORDER — GLIPIZIDE 5 MG PO TABS: 5.0000 mg | ORAL_TABLET | Freq: Every day | ORAL | 2 refills | Status: DC

## 2017-08-13 MED ORDER — ASPIRIN EC 81 MG PO TBEC: 81.0000 mg | DELAYED_RELEASE_TABLET | Freq: Every day | ORAL | 5 refills | Status: DC

## 2017-08-13 MED ORDER — GLUCOSE BLOOD VI STRP: ORAL_STRIP | 12 refills | Status: DC

## 2017-08-13 MED ORDER — METFORMIN HCL 1000 MG PO TABS: 1000.0000 mg | ORAL_TABLET | Freq: Two times a day (BID) | ORAL | 2 refills | Status: DC

## 2017-08-13 MED FILL — TRUE METRIX TEST STRIP: 30 days supply | Qty: 100 | Fill #0

## 2017-08-13 MED FILL — ?GLIPIZIDE 5MG TABLET: 5 | 30 days supply | Qty: 30 | Fill #0

## 2017-08-13 MED FILL — TRUEplus LANCETS 28G MISC: 30 days supply | Qty: 100 | Fill #0

## 2017-08-13 MED FILL — !TRUE METRIX BLOOD GLUCOSE: 365 days supply | Qty: 1 | Fill #0

## 2017-08-13 MED FILL — ?METFORMIN HCL 1,000 MG TAB: 1000 | 30 days supply | Qty: 60 | Fill #0

## 2017-08-13 NOTE — Telephone Encounter (Signed)
Pt. Came to facility stating that he needs a letter stating that he is able to work in [place]. Pt. Employer is concerned b/c pt. Has DM and would like to know if he is doing fine. Please f/u

## 2017-08-13 NOTE — Patient Instructions (Addendum)
Apply for orange card.  Monitoramento da glicemia sangunea, adultos (Blood Glucose Monitoring, Adult) O monitoramento dos nveis de acar no sangue (glicemia sangunea) ajuda no manejo do diabetes. Ele tambm ajuda voc e o seu mdico a determinar se seu plano de tratamento do diabetes est funcionando. O monitoramento da glicemia sangunea envolve a verificao da glicemia sangunea com a frequncia indicada e manuteno de um Chief Executive Officer(registro) USAAcronolgico dos seus resultados. POR QUE DEVO MONITORAR MINHA GLICEMIA SANGUNEA? A verificao da sua glicemia sangunea regularmente pode:  Ajudar voc a entender como alimentos, exerccios, doenas e medicamentos afetam sua glicemia sangunea.  Informar a voc o nvel da sua glicose sangunea a qualquer momento. Voc poder saber rapidamente se est com baixa glicemia sangunea (hipoglicemia) ou glicemia elevada (hiperglicemia).  Ajudar voc e o seu mdico a ajustar seus medicamentos como necessrio. Earleen NewportQUANDO DEVO VERIFICAR MINHA GLICEMIA SANGUNEA? Siga as instrues do seu mdico sobre com que frequncia verificar sua glicemia sangunea. Isso poder depender:  Do seu tipo de diabetes.  Do grau de controle do seu diabetes.  Dos United Parcelmedicamentos que voc estiver tomando. Se voc tiver diabetes tipo 1:  Verifique seu nvel de glicose sangunea pelo menos 2 vezes por dia.  Tambm verifique sua glicemia sangunea: ? Antes de cada injeo de insulina. ? Antes e depois de exerccios. ? Entre as refeies. ? 2 horas aps uma refeio. ? Ocasionalmente, entre as 2:00 e 3:00 da [address] ou de acordo com as instrues recebidas. ? Antes de tarefas potencialmente perigosas, como dirigir ou usar mquinas pesadas. ? Na hora de dormir.  Voc poder precisar verificar sua glicemia sangunea com maior frequncia, at 6-10 vezes por dia: ? Caso use uma bomba de insulina. ? Caso precise de mltiplas injees dirias. ? Caso seu diabetes no esteja  controlado. ? Caso adoea. ? Caso tenha histrico de hipoglicemia grave. ? Caso tenha histrico de no saber quando sua glicemia est ficando baixa (hipoglicemia sem sinais de alerta). Se voc tiver diabetes tipo 2:  Caso tome insulina ou outros medicamentos para o diabetes, verifique sua glicemia sangunea pelo menos 2 vezes por dia.  Caso esteja sob terapia intensiva com insulina, verifique sua glicemia sangunea pelo menos 4 vezes por dia. Ocasionalmente, voc poder precisar verific-la entre as 2:00 e as 3:00 da Washington Mutualmanh ou de acordo com as instrues recebidas.  Tambm verifique sua glicemia sangunea: ? Antes e depois de exerccios. ? Antes de tarefas potencialmente perigosas, como dirigir ou usar mquinas pesadas.  Voc poder precisar verificar sua glicemia sangunea com maior frequncia se: ? Seus medicamentos estiverem sendo ajustados. ? Seu diabetes no estiver controlado. ? Voc adoecer. O QUE  UM REGISTRO DA GLICEMIA SANGUNEA?  Um Games developerregistro da glicemia sangunea  uma [address] dos seus valores de glicemia sangunea. Ele ajuda voc e o seu mdico a: ? Best boyBuscar padres cronolgicos na sua glicemia sangunea. ? Ajustar seu plano de tratamento do diabetes como necessrio.  A cada vez que voc verificar sua glicemia sangunea, anote o resultado e faa anotaes sobre coisas que podem estar afetando sua glicemia Energysangunea, como a dieta e os exerccios do dia.  A maioria dos medidores de glicemia armazena as Scientist, research (physical sciences)leituras. Alguns medidores permitem que voc copie suas leituras para um computador.  COMO DEVO VERIFICAR MINHA GLICEMIA SANGUNEA? Siga as etapas a seguir para Consulting civil engineerobter leituras precisas da sua glicemia sangunea: Suprimentos necessrios  Medidor de glicose.  Tiras de teste para o seu medidor. Cada medidor tem suas prprias tiras. Voc precisa usar as tiras que vieram com  o seu medidor.  Uma agulha para espetar seu dedo (lanceta). No use as lancetas mais de uma vez.  Um  leitor de Insurance risk surveyor).  Um dirio para Set designer. Procedimento  Lave as mos com gua e sabo.  Espete a lateral do dedo (no a ponta) com a lanceta. Use um dedo diferente a cada vez.  Esfregue o dedo gentilmente at [employer] de sangue aparecer.  Siga as instrues que vieram com seu medidor para insero da tira de leitura, aplique um pouco de sangue  tira e use seu medidor de [place].  Anote seu resultado e faa qualquer anotao relevante. Locais alternativos de testagem  Alguns medidores permitem que voc use outras reas do corpo alm do dedo (locais alternativos) para testar o seu sangue.  Caso ache que est com hipoglicemia ou caso apresente hipoglicemia sem sinais de alerta, no use locais alternativos. Use, em vez disso, o dedo.  Locais alternativos podem no ser to DIRECTV os dedos porque o fluxo sanguneo  mais lento nessas reas. Isso significa que o resultado que voc obtiver pode ser retardado e pode ser diferente do resultado que voc obteria em seu dedo.  Os locais alternativos mais comuns so: ? Eber Jones. ? [place]. ? SunGard mo. Dicas adicionais  Sempre mantenha seus suprimentos com voc.  Caso tenha perguntas ou precise de ajuda, todos os medidores de glicose tm um nmero de atendimento 24 horas para o Recruitment consultant. Voc pode tambm entrar em contato com seu mdico.  Aps usar algumas caixas de tiras de testagem, ajuste (calibre) seu medidor de glicose seguindo as instrues que vieram com o medidor. Estas informaes no se destinam a substituir as recomendaes de seu mdico. No deixe de discutir quaisquer dvidas com seu mdico. Document Released: 10/06/2015 Document Reviewed: 02/19/2016 Elsevier Interactive Patient Education  2017 Elsevier Inc.    La diabetes mellitus y los alimentos (Diabetes Mellitus and Food) Es importante que controle su nivel de azcar en la sangre (glucosa). El nivel de  glucosa en sangre depende en gran medida de lo que usted come. Comer alimentos saludables en las cantidades [place] a lo largo del Futures trader, aproximadamente a la misma hora [employer], lo ayudar a Chief Operating Officer su nivel de Event organiser. Tambin puede ayudarlo a retrasar o Fish farm manager de la diabetes mellitus. Comer de Regions Financial Corporation saludable incluso puede ayudarlo a Event organiser de presin arterial y a Barista o Pharmacologist un peso saludable. Entre las recomendaciones generales para alimentarse y Water quality scientist los alimentos de forma saludable, se incluyen las siguientes:  Respetar las comidas principales y comer colaciones con regularidad. Evitar pasar largos perodos sin comer con el fin de perder peso.  Seguir una dieta que consista principalmente en alimentos de origen vegetal, como frutas, vegetales, frutos secos, legumbres y cereales integrales.  Utilizar mtodos de coccin a baja temperatura, como hornear, en lugar de mtodos de coccin a alta temperatura, como frer en abundante aceite. Trabaje con el nutricionista para aprender a Acupuncturist nutricional de las etiquetas de los alimentos. CMO PUEDEN AFECTARME LOS ALIMENTOS? Carbohidratos Los carbohidratos afectan el nivel de glucosa en sangre ms que cualquier otro tipo de alimento. El nutricionista lo ayudar a Chief Strategy Officer cuntos carbohidratos puede consumir en cada comida y ensearle a contarlos. El recuento de carbohidratos es importante para mantener la glucosa en sangre en un nivel saludable, en especial si utiliza insulina o toma determinados medicamentos para la diabetes mellitus. Alcohol El alcohol puede provocar  disminuciones sbitas de Education officer, museumla glucosa en sangre (hipoglucemia), en especial si utiliza insulina o toma determinados medicamentos para la diabetes mellitus. La hipoglucemia es una afeccin que puede poner en peligro la vida. Los sntomas de la hipoglucemia (somnolencia, mareos y Administratordesorientacin) son similares a los  sntomas de haber consumido mucho alcohol. Si el mdico lo autoriza a beber alcohol, hgalo con moderacin y siga estas pautas:  Las mujeres no deben beber ms de un trago por da, y los hombres no deben beber ms de dos tragos por Futures traderda. Un trago es igual a: ? 12 onzas (355 ml) de cerveza ? 5 onzas de vino (150 ml) de vino ? 1,5onzas (45ml) de bebidas espirituosas  No beba con el estmago vaco.  Mantngase hidratado. Beba agua, gaseosas dietticas o t helado sin azcar.  Las gaseosas comunes, los jugos y otros refrescos podran contener muchos carbohidratos y se Heritage managerdeben contar. QU ALIMENTOS NO SE RECOMIENDAN? Cuando haga las elecciones de alimentos, es importante que recuerde que todos los alimentos son distintos. Algunos tienen menos nutrientes que otros por porcin, aunque podran tener la misma cantidad de caloras o carbohidratos. Es difcil darle al cuerpo lo que necesita cuando consume alimentos con menos nutrientes. Estos son algunos ejemplos de alimentos que debera evitar ya que contienen muchas caloras y carbohidratos, pero pocos nutrientes:  NeurosurgeonGrasas trans (la mayora de los alimentos procesados incluyen grasas trans en la etiqueta de Informacin nutricional).  Gaseosas comunes.  Jugos.  Caramelos.  Dulces, como tortas, pasteles, rosquillas y Elizabethgalletas.  Comidas fritas. QU ALIMENTOS PUEDO COMER? Consuma alimentos ricos en nutrientes, que nutrirn el cuerpo y lo mantendrn saludable. Los alimentos que debe comer tambin dependern de varios factores, como:  Las caloras que necesita.  Los medicamentos que toma.  Su peso.  El nivel de glucosa en Oklaunionsangre.  El [place]nivel de presin arterial.  El nivel de colesterol. Debe consumir una amplia variedad de alimentos, por ejemplo:  Protenas. ? Cortes de Target Corporationcarne magros. ? Protenas con bajo contenido de grasas saturadas, como pescado, clara de huevo y frijoles. Evite las carnes procesadas.  Frutas y vegetales. ? Christmas IslandFrutas y  Sports administratorvegetales que pueden ayudar a AGCO Corporationcontrolar los niveles sanguneos de Trophy Clubglucosa, como Brentwoodmanzanas, mangos y batatas.  Productos lcteos. ? Elija productos lcteos sin grasa o con bajo contenido de Downsgrasa, como [place] Crosseleche, yogur y Harrisonvillequeso.  Cereales, panes, pastas y arroz. ? Elija cereales integrales, como panes multicereales, avena en grano y arroz integral. Estos alimentos pueden ayudar a controlar la presin arterial.  Rosalin HawkingGrasas. ? Alimentos que contengan grasas saludables, como frutos secos, Chartered certified accountantaguacate, aceite de Kellytonoliva, aceite de canola y pescado. TODOS LOS QUE PADECEN DIABETES MELLITUS TIENEN EL MISMO PLAN DE COMIDAS? Dado que todas las personas que padecen diabetes mellitus son distintas, no hay un solo plan de comidas que funcione para todos. Es muy importante que se rena con un nutricionista que lo ayudar a crear un plan de comidas adecuado para usted. Esta informacin no tiene Theme park managercomo fin reemplazar el consejo del mdico. Asegrese de hacerle al mdico cualquier pregunta que tenga. Document Released: 12/17/2007 Document Revised: 09/30/2014 Document Reviewed: 08/06/2013 Elsevier Interactive Patient Education  2017 ArvinMeritorElsevier Inc.

## 2017-08-13 NOTE — Progress Notes (Signed)
Subjective:  Patient ID: Mark Curtis, male    DOB: Jun 05, 1956  Age: 61 y.o. MRN: [id number]  CC: Diabetes   HPI Mark Curtis presents for diabetes.  He is accompanied by his son who interprets for him.  Patient denies foot ulcerations, nausea, paresthesia of the feet, vomitting and weight loss.  He reports blurred vision prior to diabetes diagnosis and treatment. Evaluation to date has been included: fasting blood sugar and hemoglobin A1C.  Home sugars: patient does not check sugars. Treatment to date: Metformin.  Outpatient Medications Prior to Visit  Medication Sig Dispense Refill  . atorvastatin (LIPITOR) 40 MG tablet Take 1 tablet (40 mg total) daily by mouth. 90 tablet 3  . aspirin EC 81 MG tablet Take 1 tablet (81 mg total) by mouth daily. 30 tablet 0  . metFORMIN (GLUCOPHAGE) 1000 MG tablet Take 1 tablet (1,000 mg total) by mouth 2 (two) times daily with a meal. 60 tablet 3  . albuterol (PROVENTIL HFA;VENTOLIN HFA) 108 (90 Base) MCG/ACT inhaler Inhale 2 puffs into the lungs every 4 (four) hours as needed for wheezing or shortness of breath. 1 Inhaler 3   No facility-administered medications prior to visit.     ROS Review of Systems  Constitutional: Negative.   Respiratory: Negative.   Cardiovascular: Negative.   Gastrointestinal: Negative.   Skin: Negative.    Objective:  BP 111/70 (BP Location: Left Arm, Patient Position: Sitting, Cuff Size: Normal)   Pulse 89   Temp 97.7 F (36.5 C) (Oral)   Resp 18   Ht '5\' 11"'  (1.803 m)   Wt 195 lb 6.4 oz (88.6 kg)   SpO2 96%   BMI 27.25 kg/m   BP/Weight 08/13/2017 08/11/2017 23/95/3202  Systolic BP 334 356 861  Diastolic BP 70 76 82  Wt. (Lbs) 195.4 194.12 194.4  BMI 27.25 27.07 27.11     Physical Exam  Constitutional: He appears well-developed and well-nourished.  Eyes: Conjunctivae are normal. Pupils are equal, round, and reactive to light.  Cardiovascular: Normal rate, regular rhythm, normal heart sounds and intact  distal pulses.  Pulmonary/Chest: Effort normal and breath sounds normal.  Abdominal: Soft. Bowel sounds are normal.  Skin: Skin is warm and dry.  Nursing note and vitals reviewed.  Diabetic Foot Exam - Simple   Simple Foot Form Diabetic Foot exam was performed with the following findings:  Yes 08/13/2017 11:34 AM  Visual Inspection No deformities, no ulcerations, no other skin breakdown bilaterally:  Yes Sensation Testing Intact to touch and monofilament testing bilaterally:  Yes Pulse Check Posterior Tibialis and Dorsalis pulse intact bilaterally:  Yes Comments      Assessment & Plan:   1. Uncontrolled type 2 diabetes mellitus with hyperglycemia (HCC) Glipizide added. Start checking blood sugars twice daily. Bring glucometer or blood glucose log to next office visit. - Glucose (CBG) - Blood Glucose Monitoring Suppl (TRUE METRIX METER) w/Device KIT; 1 Device by Does not apply route once for 1 dose.  Dispense: 1 kit; Refill: 0 - TRUEPLUS LANCETS 28G MISC; 1 kit by Does not apply route once for 1 dose.  Dispense: 100 each; Refill: 12 - glucose blood test strip; Use as instructed  Dispense: 100 each; Refill: 12 - glipiZIDE (GLUCOTROL) 5 MG tablet; Take 1 tablet (5 mg total) by mouth daily before breakfast.  Dispense: 30 tablet; Refill: 2 - Ambulatory referral to Ophthalmology - metFORMIN (GLUCOPHAGE) 1000 MG tablet; Take 1 tablet (1,000 mg total) by mouth 2 (two) times daily with a meal.  Dispense: 60 tablet; Refill: 2  2. Screen for colon cancer  - Fecal occult blood, imunochemical  3. Blurry vision, bilateral  - Ambulatory referral to Ophthalmology  4. Medication refill  - aspirin EC 81 MG tablet; Take 1 tablet (81 mg total) by mouth daily.  Dispense: 30 tablet; Refill: 5     Follow-up: Return in about 1 month (around 09/12/2017) for DM.   Alfonse Spruce FNP

## 2017-08-13 NOTE — Progress Notes (Signed)
Patient is here for DM   Patient has eaten for today 2 tortillas & eggs   Patient has taking his med for today

## 2017-08-15 ENCOUNTER — Ambulatory Visit (HOSPITAL_COMMUNITY): Admit: 2017-08-15 | Payer: Self-pay | Admitting: Internal Medicine

## 2017-08-15 ENCOUNTER — Encounter (HOSPITAL_COMMUNITY): Payer: Self-pay

## 2017-08-15 SURGERY — LEFT HEART CATH AND CORONARY ANGIOGRAPHY
Anesthesia: LOCAL

## 2017-08-18 NOTE — Telephone Encounter (Signed)
Pt. Came to facility stating that he needs a letter stating that he is able to work in [place]. Pt. Employer is concerned b/c pt. Has DM and would like to know if he is doing fine. Please advice?

## 2017-08-21 NOTE — Telephone Encounter (Signed)
Cannot provide letter at this time until patient is cleared  by cardiology. Recommend he contact and/or follow up with cardiology office regarding letter.

## 2017-09-04 ENCOUNTER — Encounter: Payer: Self-pay | Admitting: Internal Medicine

## 2017-09-04 ENCOUNTER — Ambulatory Visit (INDEPENDENT_AMBULATORY_CARE_PROVIDER_SITE_OTHER): Payer: Self-pay | Admitting: Internal Medicine

## 2017-09-04 VITALS — BP 114/78 | HR 70 | Resp 16 | Ht 69.0 in | Wt 197.8 lb

## 2017-09-04 DIAGNOSIS — R42 Dizziness and giddiness: Secondary | ICD-10-CM

## 2017-09-04 DIAGNOSIS — R55 Syncope and collapse: Secondary | ICD-10-CM

## 2017-09-04 MED ORDER — LISINOPRIL 2.5 MG PO TABS: 2.5000 mg | ORAL_TABLET | Freq: Every day | ORAL | 1 refills | Status: DC

## 2017-09-04 NOTE — Patient Instructions (Addendum)
Medication Instructions: Your physician has recommended you make the following change in your medication: -1) START Lisinopril 2.5 MG - Take 1 Tablet (2.5 mg) by mouth daily  Labwork: None Ordered  Procedures/Testing: None Ordered  Follow-Up: Your physician recommends that you schedule a follow-up appointment in: 3-4 WEEKS with Vin Bhagat, PA-C  If you need a refill on your cardiac medications before your next appointment, please call your pharmacy.

## 2017-09-04 NOTE — Progress Notes (Signed)
ELECTROPHYSIOLOGY CONSULT NOTE  Patient ID: Mark Curtis, MRN: [id number], DOB/AGE: 26-Mar-1956 61 y.o. Admit date: (Not on file) Date of Consult: 09/04/2017  Primary Physician: Lizbeth BarkHairston, Mandesia R, FNP Primary Cardiologist:  new     Mark Schultzablo Detlefsen is a 61 y.o. male who is being seen today for the evaluation of exertional dyspnea and syncope  *.    HPI Mark Schultzablo Defoor is a 61 y.o. male  Seen following an expected but not consummated cath.  All this was recommended because of exertional shortness of breath and fatigue.  Specifically, as opposed to the note from the PA, he denies chest pain.  I should note that he was interviewed through an interpreter.  The symptoms have been more prominent over recent months.  They are relieved by rest.  He was found in the interim to be diabetic presenting with polyuria and polydipsia.  His hemoglobin A1c was 12.7 he feels considerably better since starting on medications.  Because of his symptoms and risk factors, catheterization was scheduled.  He failed to show.  He also has a history of syncope x2.  Both of these episodes have occurred after working and getting up at night to go to the bathroom.  They were brief in onset and offset.  He has some orthostatic intolerance.  He has had some exercise associated palpitations.  There are no palpitations associated with his syncopal  He  smokes.  However, it is noteworthy that he is decreased his cigarette use more than 75% over the last couple of months.  He has been started on statin therapy  He denies peripheral edema nocturnal dyspnea or orthopnea.    Past Medical History:  Diagnosis Date  . Chest pain   . Chronic headaches   . DM (diabetes mellitus) (HCC)   . Hyperglycemia   . Polydipsia   . Polyuria   . Spell of dizziness    WHEN STANDING  . Vomiting       Surgical History: History reviewed. No pertinent surgical history.   Home Meds: Prior to Admission medications   Medication Sig Start  Date End Date Taking? Authorizing Provider  aspirin EC 81 MG tablet Take 1 tablet (81 mg total) by mouth daily. 08/13/17  Yes Lizbeth BarkHairston, Mandesia R, FNP  atorvastatin (LIPITOR) 40 MG tablet Take 1 tablet (40 mg total) daily by mouth. 08/11/17 11/09/17 Yes Bhagat, Bhavinkumar, PA  glipiZIDE (GLUCOTROL) 5 MG tablet Take 1 tablet (5 mg total) by mouth daily before breakfast. 08/13/17  Yes Hairston, Mandesia R, FNP  glucose blood test strip Use as instructed 08/13/17  Yes Hairston, Mandesia R, FNP  metFORMIN (GLUCOPHAGE) 1000 MG tablet Take 1 tablet (1,000 mg total) by mouth 2 (two) times daily with a meal. 08/13/17  Yes Hairston, Mandesia R, FNP  albuterol (PROVENTIL HFA;VENTOLIN HFA) 108 (90 Base) MCG/ACT inhaler Inhale 2 puffs into the lungs every 4 (four) hours as needed for wheezing or shortness of breath. Patient not taking: Reported on 09/04/2017 08/30/16   Muthersbaugh, Dahlia ClientHannah, PA-C    Allergies: No Known Allergies  Social History   Socioeconomic History  . Marital status: Married    Spouse name: Not on file  . Number of children: Not on file  . Years of education: Not on file  . Highest education level: Not on file  Social Needs  . Financial resource strain: Not on file  . Food insecurity - worry: Not on file  . Food insecurity - inability: Not on file  .  Transportation needs - medical: Not on file  . Transportation needs - non-medical: Not on file  Occupational History  . Not on file  Tobacco Use  . Smoking status: Current Some Day Smoker    Packs/day: 0.25    Years: 30.00    Pack years: 7.50    Types: Cigarettes  . Smokeless tobacco: Never Used  Substance and Sexual Activity  . Alcohol use: No  . Drug use: No  . Sexual activity: Not on file  Other Topics Concern  . Not on file  Social History Narrative  . Not on file     Family History  Problem Relation Age of Onset  . Diabetes Father      ROS:  Please see the history of present illness.     All other systems  reviewed and negative.    Physical Exam: Blood pressure 114/78, pulse 70, resp. rate 16, height 5\' 9"  (1.753 m), weight 197 lb 12.8 oz (89.7 kg), SpO2 98 %. General: Well developed, well nourished male in no acute distress. Head: Normocephalic, atraumatic, sclera non-icteric, no xanthomas, nares are without discharge. EENT: normal  Lymph Nodes:  none Neck: Negative for carotid bruits. JVD not elevated. Back:without scoliosis kyphosis  Lungs: Clear bilaterally to auscultation without wheezes, rales, or rhonchi. Breathing is unlabored. Heart: RRR with S1 S2. No   murmur . No rubs, or gallops appreciated. Abdomen: Soft, non-tender, non-distended with normoactive bowel sounds. No hepatomegaly. No rebound/guarding. No obvious abdominal masses. Msk:  Strength and tone appear normal for age. Extremities: No clubbing or cyanosis. No edema.  Distal pedal pulses are 2+ and equal bilaterally. Skin: Warm and Dry Neuro: Alert and oriented X 3. CN III-XII intact Grossly normal sensory and motor function . Psych:  Responds to questions appropriately with a normal affect.      Labs: Cardiac Enzymes No results for input(s): CKTOTAL, CKMB, TROPONINI in the last 72 hours. CBC Lab Results  Component Value Date   WBC 10.1 06/24/2017   HGB 15.5 06/24/2017   HCT 44.0 06/24/2017   MCV 92.2 06/24/2017   PLT 201 06/24/2017   PROTIME: No results for input(s): LABPROT, INR in the last 72 hours. Chemistry No results for input(s): NA, K, CL, CO2, BUN, CREATININE, CALCIUM, PROT, BILITOT, ALKPHOS, ALT, AST, GLUCOSE in the last 168 hours.  Invalid input(s): LABALBU Lipids Lab Results  Component Value Date   CHOL 204 (H) 08/11/2017   HDL 32 (L) 08/11/2017   LDLCALC 132 (H) 08/11/2017   TRIG 200 (H) 08/11/2017   BNP No results found for: PROBNP Thyroid Function Tests: No results for input(s): TSH, T4TOTAL, T3FREE, THYROIDAB in the last 72 hours.  Invalid input(s): FREET3 Miscellaneous No results  found for: DDIMER  Radiology/Studies:  No results found.  EKG: NSR 71 15/10/38   Assessment and Plan:   Syncope-probably peri-micturition  Exertional dyspnea concerned about anginal equivalent  With diabetes     The patient has exertional dyspnea and fatigue.  I am concerned that this is an anginal equivalent.  He says his symptoms are less since he has been started on medication.  He has not been reported.  As the data are primarily for symptom reduction with stable angina, I will allow his symptoms to dictate catheterization as he is reluctant to undergo it at the present time.  With his diabetes and likely heart disease, we will begin him on an ACE inhibitor-lisinopril 2.5  His syncope is been pre-staged by orthostatic lightheadedness.  Events occurred  prior to micturition.  I am surprised that the paucity of recovery symptoms; however, his ECG is normal and his echo was normal making life-threatening ventricular arrhythmias unlikely.     Sherryl Manges

## 2017-09-17 ENCOUNTER — Ambulatory Visit: Payer: Self-pay | Admitting: Family Medicine

## 2017-09-19 NOTE — Telephone Encounter (Signed)
No answer. Unable to leave message on voicemail. Unable to reach patient.

## 2017-10-14 NOTE — Progress Notes (Deleted)
Cardiology Office Note    Date:  10/14/2017   ID:  Mark Curtis, DOB 1956-04-29, MRN [id number]  PCP:  Lizbeth Bark, FNP  Cardiologist/Electrophysiologist: Dr. Graciela Husbands  Chief Complaint: 2  Months follow up  History of Present Illness:   Mark Curtis is a 62 y.o. male with hx of syncope likely related to orthostatic hypotension, tobacco smoking, DM presents for follow up.  Seen by me and Dr. Graciela Husbands 08/11/17 for new patient evaluation of exertional chest tightness, shortness of breath and fatigue. Recommended cath for symptoms concerning for angina however declined for personal and financial reason.   He was seen by Dr. Graciela Husbands 09/04/17 for follow up. Symptoms improved since stating medications. No work up recommended to syncope as it occurred prior to micturition and  pre-staged by orthostatic lightheadedness. Echo reassuring.  Here today for follow up.      Past Medical History:  Diagnosis Date  . Chest pain   . Chronic headaches   . DM (diabetes mellitus) (HCC)   . Hyperglycemia   . Polydipsia   . Polyuria   . Spell of dizziness    WHEN STANDING  . Vomiting     No past surgical history on file.  Current Medications: Prior to Admission medications   Medication Sig Start Date End Date Taking? Authorizing Provider  albuterol (PROVENTIL HFA;VENTOLIN HFA) 108 (90 Base) MCG/ACT inhaler Inhale 2 puffs into the lungs every 4 (four) hours as needed for wheezing or shortness of breath. Patient not taking: Reported on 09/04/2017 08/30/16   Muthersbaugh, Dahlia Client, PA-C  aspirin EC 81 MG tablet Take 1 tablet (81 mg total) by mouth daily. 08/13/17   Lizbeth Bark, FNP  atorvastatin (LIPITOR) 40 MG tablet Take 1 tablet (40 mg total) daily by mouth. 08/11/17 11/09/17  Ltanya Bayley, Sharrell Ku, PA  glipiZIDE (GLUCOTROL) 5 MG tablet Take 1 tablet (5 mg total) by mouth daily before breakfast. 08/13/17   Arrie Senate R, FNP  glucose blood test strip Use as instructed 08/13/17    Lizbeth Bark, FNP  lisinopril (PRINIVIL,ZESTRIL) 2.5 MG tablet Take 1 tablet (2.5 mg total) by mouth daily. 09/04/17 12/03/17  Duke Salvia, MD  metFORMIN (GLUCOPHAGE) 1000 MG tablet Take 1 tablet (1,000 mg total) by mouth 2 (two) times daily with a meal. 08/13/17   Lizbeth Bark, FNP    Allergies:   Patient has no known allergies.   Social History   Socioeconomic History  . Marital status: Married    Spouse name: Not on file  . Number of children: Not on file  . Years of education: Not on file  . Highest education level: Not on file  Social Needs  . Financial resource strain: Not on file  . Food insecurity - worry: Not on file  . Food insecurity - inability: Not on file  . Transportation needs - medical: Not on file  . Transportation needs - non-medical: Not on file  Occupational History  . Not on file  Tobacco Use  . Smoking status: Current Some Day Smoker    Packs/day: 0.25    Years: 30.00    Pack years: 7.50    Types: Cigarettes  . Smokeless tobacco: Never Used  Substance and Sexual Activity  . Alcohol use: No  . Drug use: No  . Sexual activity: Not on file  Other Topics Concern  . Not on file  Social History Narrative  . Not on file     Family History:  The patient's family  history includes Diabetes in his father. ***  ROS:   Please see the history of present illness.    ROS All other systems reviewed and are negative.   PHYSICAL EXAM:   VS:  There were no vitals taken for this visit.   GEN: Well nourished, well developed, in no acute distress  HEENT: normal  Neck: no JVD, carotid bruits, or masses Cardiac: ***RRR; no murmurs, rubs, or gallops,no edema  Respiratory:  clear to auscultation bilaterally, normal work of breathing GI: soft, nontender, nondistended, + BS MS: no deformity or atrophy  Skin: warm and dry, no rash Neuro:  Alert and Oriented x 3, Strength and sensation are intact Psych: euthymic mood, full affect  Wt Readings  from Last 3 Encounters:  09/04/17 197 lb 12.8 oz (89.7 kg)  08/13/17 195 lb 6.4 oz (88.6 kg)  08/11/17 194 lb 1.9 oz (88.1 kg)      Studies/Labs Reviewed:   EKG:  EKG is ordered today.  The ekg ordered today demonstrates ***  Recent Labs: 06/24/2017: BUN 11; Creatinine, Ser 0.78; Hemoglobin 15.5; Platelets 201; Potassium 4.3; Sodium 135   Lipid Panel    Component Value Date/Time   CHOL 204 (H) 08/11/2017 1137   TRIG 200 (H) 08/11/2017 1137   HDL 32 (L) 08/11/2017 1137   CHOLHDL 6.4 (H) 08/11/2017 1137   LDLCALC 132 (H) 08/11/2017 1137    Additional studies/ records that were reviewed today include:   Echocardiogram: 08/13/17 Study Conclusions  - Left ventricle: The cavity size was normal. Wall thickness was   normal. Systolic function was normal. The estimated ejection   fraction was in the range of 60% to 65%. Low normal GLS at -15%.   Wall motion was normal; there were no regional wall motion   abnormalities. Doppler parameters are consistent with abnormal   left ventricular relaxation (grade 1 diastolic dysfunction). The   E/e&' ratio is <8, suggesting normal LV filling pressure. - Mitral valve: Mildly thickened leaflets . There was trivial   regurgitation. - Left atrium: The atrium was normal in size. 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 - Tricuspid valve: There was trivial regurgitation. - Pulmonary arteries: PA peak pressure: 20 mm Hg (S). - Inferior vena cava: The vessel was normal in size. The   respirophasic diameter changes were in the normal range (= 50%),   consistent with normal central venous pressure.  Impressions:  - LVEF 60-65%, normal wall thickness, normal wall motion, low   normal GLS at -15%, grade 1 DD, normal LV filling pressure,   trivial MR, normal LA size, aneurysmal IAS- possible PFO, trivial   TR, normal RVSP, normal IVC.  ASSESSMENT & PLAN:    1. Stable angina    Medication Adjustments/Labs and Tests  Ordered: Current medicines are reviewed at length with the patient today.  Concerns regarding medicines are outlined above.  Medication changes, Labs and Tests ordered today are listed in the Patient Instructions below. There are no Patient Instructions on file for this visit.   Lorelei PontSigned, Anysia Choi, GeorgiaPA  10/14/2017 11:52 AM    [facility] [address], [place], [place]  [address] Phone: [phone]) [phone]; Fax: [phone]

## 2017-10-16 ENCOUNTER — Ambulatory Visit: Payer: Self-pay | Admitting: Physician Assistant

## 2017-10-23 ENCOUNTER — Encounter: Payer: Self-pay | Admitting: Physician Assistant

## 2017-11-13 ENCOUNTER — Encounter: Payer: Self-pay | Admitting: Physician Assistant

## 2017-11-17 MED FILL — ?METFORMIN HCL 1,000 MG TAB: 1000 | 30 days supply | Qty: 60 | Fill #1

## 2018-02-19 ENCOUNTER — Other Ambulatory Visit: Payer: Self-pay | Admitting: Family Medicine

## 2018-02-19 ENCOUNTER — Other Ambulatory Visit: Payer: Self-pay | Admitting: *Deleted

## 2018-02-19 DIAGNOSIS — E1165 Type 2 diabetes mellitus with hyperglycemia: Secondary | ICD-10-CM

## 2018-02-19 MED ORDER — METFORMIN HCL 1000 MG PO TABS: 1000.0000 mg | ORAL_TABLET | Freq: Two times a day (BID) | ORAL | 0 refills | Status: DC

## 2018-02-19 MED FILL — metFORMIN HCL 1000 MG TABS: 1000 | 30 days supply | Qty: 60 | Fill #0

## 2018-02-19 NOTE — Telephone Encounter (Signed)
Patient came to the facility requesting a refill on his Metformin. Patient has scheduled an appt. For 03/16/18 to est.care. Patient uses [facility] pharmacy. Please f/u

## 2018-02-19 NOTE — Telephone Encounter (Signed)
Rx sent to [facility] pharmacy.  

## 2018-03-16 ENCOUNTER — Encounter: Payer: Self-pay | Admitting: Nurse Practitioner

## 2018-03-16 ENCOUNTER — Ambulatory Visit: Payer: Self-pay | Attending: Nurse Practitioner | Admitting: Nurse Practitioner

## 2018-03-16 VITALS — BP 125/83 | HR 83 | Temp 98.2°F | Ht 71.0 in | Wt 199.4 lb

## 2018-03-16 DIAGNOSIS — Z833 Family history of diabetes mellitus: Secondary | ICD-10-CM | POA: Insufficient documentation

## 2018-03-16 DIAGNOSIS — Z7984 Long term (current) use of oral hypoglycemic drugs: Secondary | ICD-10-CM | POA: Insufficient documentation

## 2018-03-16 DIAGNOSIS — E782 Mixed hyperlipidemia: Secondary | ICD-10-CM

## 2018-03-16 DIAGNOSIS — Z7982 Long term (current) use of aspirin: Secondary | ICD-10-CM | POA: Insufficient documentation

## 2018-03-16 DIAGNOSIS — Z760 Encounter for issue of repeat prescription: Secondary | ICD-10-CM | POA: Insufficient documentation

## 2018-03-16 DIAGNOSIS — E1165 Type 2 diabetes mellitus with hyperglycemia: Secondary | ICD-10-CM

## 2018-03-16 LAB — POCT GLYCOSYLATED HEMOGLOBIN (HGB A1C): HEMOGLOBIN A1C: 7.7 % — AB (ref 4.0–5.6)

## 2018-03-16 LAB — GLUCOSE, POCT (MANUAL RESULT ENTRY): POC Glucose: 160 mg/dl — AB (ref 70–99)

## 2018-03-16 MED ORDER — LISINOPRIL 2.5 MG PO TABS: 2.5000 mg | ORAL_TABLET | Freq: Every day | ORAL | 1 refills | Status: DC

## 2018-03-16 MED ORDER — ATORVASTATIN CALCIUM 40 MG PO TABS: 40.0000 mg | ORAL_TABLET | Freq: Every day | ORAL | 3 refills | Status: DC

## 2018-03-16 MED ORDER — METFORMIN HCL 1000 MG PO TABS: 1000.0000 mg | ORAL_TABLET | Freq: Two times a day (BID) | ORAL | 0 refills | Status: DC

## 2018-03-16 MED ORDER — GLUCOSE BLOOD VI STRP: ORAL_STRIP | 12 refills | Status: DC

## 2018-03-16 MED ORDER — TRUEPLUS LANCETS 28G MISC: 3 refills | Status: DC

## 2018-03-16 NOTE — Patient Instructions (Signed)
Cmo evitar los problemas relacionados con la diabetes mellitus How to Avoid Diabetes Mellitus Problems Usted puede actuar para prevenir o disminuir los problemas causados por la diabetes (diabetes mellitus). Seguir un plan para la diabetes y cuidarse usted mismo puede reducir el riesgo de complicaciones graves o potencialmente mortales. Controle su diabetes  Siga las indicaciones del mdico acerca de cmo tratar la diabetes. Su diabetes puede ser tratada por un equipo de profesionales de la salud que le pueden ensear a cuidarse y pueden responder las preguntas que tenga.  Instryase sobre su afeccin para tomar decisiones saludables en relacin a los alimentos y la actividad fsica.  Contrlese la glucemia (glucosa en la sangre) con la frecuencia que le hayan indicado. El Biomedical engineer a decidir con qu frecuencia debe revisar su nivel de glucosa en la sangre, en funcin de los objetivos de su tratamiento y el xito en cumplirlos.  Consltele a su mdico si debe tomar aspirina en dosis bajas a diario y cul es la dosis recomendada para usted. Tomar aspirina en dosis bajas a diario se recomienda para ayudar a prevenir la enfermedad cardiovascular. No consuma nicotina ni tabaco No consuma ningn producto que contenga nicotina o tabaco, como cigarrillos y cigarrillos electrnicos. Si necesita ayuda para dejar de fumar, consulte al mdico. La nicotina aumenta el riesgo de problemas con la diabetes. Si deja de consumir nicotina:  Se reduce el riesgo de ataque cardaco, accidente cerebrovascular, enfermedades nerviosas y enfermedad renal.  Pueden mejorar el colesterol y sus niveles de presin arterial.  La circulacin sangunea mejorar.  Mantenga su presin arterial bajo control Para controlar la presin arterial:  Siga las indicaciones del mdico sobre la planificacin de las comidas, el ejercicio y los medicamentos.  Asegrese de que el [employer] mida la presin arterial en cada visita  mdica.  La medicin de la presin arterial consiste de [employer]. En general, el objetivo es mantener el nmero de [place] (presin sistlica) en un valor mximo de 130 y el nmero de abajo (presin diastlica) en un valor mximo de 80. El mdico puede recomendarle un objetivo de presin arterial con valores ms bajos. Su objetivo de presin arterial individualizado se determina sobre la base de:  La edad.  Los medicamentos.  El tiempo transcurrido desde que tiene diabetes.  Otras enfermedades que padezca.  Mantenga los niveles de colesterol bajo control Para controlar su colesterol:  Siga las indicaciones del mdico sobre la planificacin de las comidas, el ejercicio y los medicamentos.  Controle su nivel de colesterol por lo menos una vez al ao.  Es posible que le receten medicamentos para bajar sus niveles de colesterol (estatina). Si no est tomando una estatina, pregntele a su mdico si debera tomarla.  Si controla su colesterol, podra:  Ayudarle a prevenir enfermedades del corazn y un accidente cerebrovascular. Estos son los problemas de salud ms comunes para las personas con diabetes.  Mejorar el flujo sanguneo.  Planifique y cumpla con sus exmenes fsicos y oculares anuales Su mdico le informar con qu frecuencia debe atenderse, dependiendo de su plan de control de la diabetes. Concurra a todas las visitas de control como se le haya indicado. Esto es importante para identificar rpidamente posibles problemas y poder evitar o tratar las complicaciones.  Cada visita a su mdico deber incluir la medicin de: ? El [place]. ? La presin arterial. ? La glucemia.  El nivel de A1c (hemoglobina A1c) debe controlarse: ? Al menos 2 veces al ao si cumple los objetivos  del tratamiento. ? [employer], si no cumple los objetivos del tratamiento o si sus objetivos [place].  Los lpidos de la sangre (perfil lipdico) deben controlarse anualmente. Tambin hay que  controlar anualmente la presencia de protenas en la orina (microalbuminuria).  Si tiene diabetes tipo1, hgase un examen ocular en el trmino de 3 a 5aos despus del diagnstico y, luego, Ardelia Mems vez al ao despus del Tree surgeon.  Si tiene diabetes tipo2, hgase un examen ocular tan pronto como le diagnostiquen la enfermedad y, [place], una vez por ao despus del Tree surgeon.  Mantngase al da con las vacunas Se recomienda que reciba:  [place] antigripal (influenza) todos los [place].  Vacuna contra la neumona (vacuna antineumoccica) y contra la hepatitisB. Si tiene 66aos o ms, puede recibir Engineer, manufacturing como una serie de dos inyecciones [place].  Pregntele al mdico qu otras vacunas se pueden recomendar. Cuide sus pies La diabetes puede hacer que la circulacin sangunea en las piernas y los pies sea deficiente. Por eso, el cuidado de los pies es muy importante. La diabetes puede provocar:  Que la piel de los pies se vuelva ms fina, se rompa ms fcilmente y cicatrice ms lento.  Dao nervioso en las piernas y los pies, lo que puede Engineer, manufacturing en disminucin de la sensibilidad. Es posible que no advierta las heridas ms pequeas que pueden conducir a Education administrator graves.  Para evitar problemas en los pies:  Examnese a diario la piel y los pies en busca de cortes, moretones, enrojecimiento, ampollas o llagas.  Programe una cita para que el [employer] controle los pies una vez por ao. Este examen incluye: ? Inspeccionar la estructura y la piel de sus pies. ? Revisar los pulsos y sensaciones de sus pies.  Asegrese de que su mdico realice un examen visual de los pies en cada visita mdica.  Cornucopia con diabetes mal controlada son ms propensas a Music therapist (periodontales). La diabetes puede hacer que las enfermedades periodontales sean ms difciles de Chief Technology Officer. Las [employer], si se dejan sin  tratamiento, pueden conducir a la prdida de dientes. Para prevenirlas:  Cepllese los [employer].  Use hilo dental al menos una vez al da.  Visite al [employer] por ao.  Beba de manera responsable Limite el consumo de alcohol a no ms de 1 medida por da si es mujer y no est Music therapist y a 2 medidas por da si es hombre. Una medida equivale a 12onzas de cerveza, 5onzas de vino o 1onzas de bebidas alcohlicas de alta graduacin. Es importante que consuma alimentos cuando bebe alcohol para evitar la glucemia baja (hipoglucemia). Evite consumir alcohol si:  Tiene antecedentes de consumo excesivo o dependencia de alcohol.  Est embarazada.  Tiene enfermedad heptica, pancreatitis, neuropata avanzada, o hipertrigliceridemia grave.  Disminuya el nivel de estrs Vivir con diabetes puede ser estresante. Cuando usted est bajo estrs, la glucemia puede verse afectada de dos maneras:  Las hormonas del estrs pueden hacer que la glucosa en la sangre se eleve.  Probablemente no se cuid lo suficiente.  Est al tanto del nivel de estrs y haga los cambios que sean necesarios para ayudar a Air cabin crew las situaciones difciles. Para disminuir sus niveles de estrs:  Considere la posibilidad de Chief Financial Officer en un grupo de apoyo.  Realice relajacin o meditacin planificada.  Haga un pasatiempos que disfrute.  Mantenga relaciones saludables.  Haga ejercicios regularmente.  Trabaje con  su mdico o un profesional de la salud mental.  Resumen  Usted puede actuar para prevenir o disminuir los problemas causados por la diabetes (diabetes mellitus). Seguir un plan para la diabetes y cuidarse usted mismo puede reducir el riesgo de complicaciones graves o potencialmente mortales.  Siga las indicaciones del mdico acerca de cmo tratar la diabetes. Su diabetes puede ser tratada por un equipo de profesionales de la salud que le pueden ensear a cuidarse y pueden responder las  preguntas que tenga.  Su mdico le informar con qu frecuencia debe atenderse, dependiendo de su plan de control de la diabetes. Concurra a todas las visitas de control como se le haya indicado. Esto es importante para identificar rpidamente posibles problemas y poder evitar o tratar las complicaciones. Esta informacin no tiene Theme park managercomo fin reemplazar el consejo del mdico. Asegrese de hacerle al mdico cualquier pregunta que tenga. Document Released: 08/29/2011 Document Revised: 08/21/2016 Document Reviewed: 11/03/2013 Elsevier Interactive Patient Education  2018 [employer]  Pilgrim's PrideColesterol Cholesterol El colesterol es grasa. El organismo necesita una pequea cantidad de colesterol. El colesterol (placa) puede acumularse en los vasos sanguneos (arterias). Esto lo hace ms propenso a sufrir infartos de miocardio o accidentes cerebrovasculares. No puede sentir el nivel de colesterol. La nica forma de saber si el nivel es alto es mediante un anlisis de [place]. Guarde los CDW Corporationresultados del anlisis. Trabaje con el mdico para Engineer, maintenance (IT)mantener el colesterol en un nivel adecuado. Qu significan los resultados?  El colesterol total es la cantidad de colesterol que hay en la [place]angre.  El LDL es el colesterol [place]malo. Este tipo es el que puede acumularse. Intente mantener el nivel de LDL bajo.  El HDL es el colesterol bueno. Limpia los vasos sanguneos y elimina el colesterol LDL. Intente mantener el nivel de HDL alto.  Los triglicridos son grasas que el organismo puede Academic librarianalmacenar o quemar como fuente de Engineer, drillingenerga. Cules son los niveles buenos de colesterol?  El colesterol total debe estar por debajo de 200.  Lo ms aconsejable para quienes tienen riesgos para la salud es mantener el nivel de LDL por debajo de 100. Lo ms aconsejable para quienes tienen muchos riesgos para la salud es mantener el nivel de LDL por debajo de 70.  Se aconseja mantener un nivel de HDL es por encima de 40. Pero lo mejor es tener un  nivel de HDL de 60 o superior.  Los triglicridos por debajo de 150. Cmo puedo bajar el colesterol? Dieta Siga el programa de alimentacin que el mdico le indique.  Elija pescados o carne blanca de pollo y pavo asados u horneados. Intente no comer carne roja, alimentos fritos, salchichas ni embutidos.  Coma gran cantidad de frutas y verduras frescas.  Elija los cereales integrales, los frijoles, las pastas, las papas y los cereales.  Elija aceite de oliva, aceite de maz o aceite de [place]. Solo use poca cantidad.  Intente no comer [place], Los Cerrillosmayonesa, Indiamargarina o aceites de Wabashapalmiste.  Intente no comer alimentos que contengan grasas trans.  Elija alimentos lcteos descremados o sin grasa. ? Beba leche descremada o sin grasa. ? Coma yogur y quesos descremados o sin grasa. ? Intente no consumir leche entera o crema. ? Intente no comer helados, yemas de huevo ni quesos enteros.  Los postres sanos incluyen la torta ngel, los bocadillos de Garden Cityjengibre, las Gaffergalletas con forma de Makahaanimales, los caramelos duros, los helados de agua y el yogur descremado o sin grasa. Intente no comer masas, tortas, pasteles y galletas.  La prctica de actividad fsica. Siga el programa de actividad fsica que le indique el mdico.  Sea ms [place]. Intente hacer los trabajos de [place], salir a Advertising account planner y usar las escaleras.  Consulte al mdico cmo puede aumentar su actividad.  Medicamentos  [employer] de venta libre y los recetados solamente como se lo haya indicado el mdico.  Esta informacin no tiene Theme park manager el consejo del mdico. Asegrese de hacerle al mdico cualquier pregunta que tenga. Document Released: 10/12/2010 Document Revised: 12/09/2016 Document Reviewed: 03/21/2016 Elsevier Interactive Patient Education  2018 Elsevier Inc.  Colesterol elevado High Cholesterol El colesterol elevado es una afeccin en la que la sangre tiene niveles altos de una sustancia  [place], cerosa y parecida a la grasa (colesterol). El organismo humano necesita una pequea cantidad de colesterol. El hgado fabrica todo el colesterol que el organismo necesita. El exceso de colesterol proviene de los alimentos que comemos. La sangre transporta el colesterol desde el hgado a travs de los vasos sanguneos. Si tiene el colesterol elevado, este puede depositarse (formar placas) en las paredes de los vasos sanguneos (arterias). Las IT trainer y la rigidez de las arterias. Las placas de colesterol aumentan el riesgo de sufrir un infarto de miocardio y un accidente cerebrovascular. Trabaje con el mdico para [employer] concentraciones de colesterol en un rango saludable. Qu incrementa el riesgo? Es ms probable que Dietitian en las personas que:  Consumen alimentos con alto contenido de grasa animal (grasa saturada) o colesterol.  Tienen sobrepeso.  No hacen suficiente ejercicio fsico.  Tienen antecedentes familiares de colesterol elevado.  Cules son los signos o los sntomas? Esta afeccin no presenta sntomas. Cmo se diagnostica? Esta afeccin podra diagnosticarse a [employer] de [place] de [place].  Si es mayor de 20aos, es posible que el mdico le controle el colesterol cada [id number].  Los controles pueden ser ms frecuentes si ya tuvo el colesterol elevado u otros factores de riesgo de enfermedades cardacas.  En el anlisis de sangre de [place], se determina lo siguiente:  El colesterol "malo" (colesterol LDL). Este es el principal tipo de colesterol que causa enfermedades cardacas. El nivel recomendado de LDL es de menos de100.  El colesterol "bueno" (colesterol HDL). Este tipo ayuda a Health visitor las enfermedades cardacas limpiando las arterias y arrastrando el LDL. El nivel recomendado de HDL es de60 o superior.  Triglicridos. Estos son grasas que el organismo puede Academic librarian o quemar  como fuente de [place]. El nivel recomendado de triglicridos es de menos de 150.  Colesterol total. Esta es una medicin de la cantidad total de colesterol en la sangre, que incluye el colesterol LDL, el colesterol HDL y los triglicridos. El valor saludable es de menos de200.  Cmo se trata? Esta afeccin se trata con cambios en la dieta y en el estilo de vida, y con medicamentos. Cambios en la [employer], la ingesta de una mayor cantidad de cereales integrales, frutas, verduras, frutos secos y pescado.  Tambin podran incluir la reduccin del consumo de carnes rojas y alimentos con Medical laboratory scientific officer. Cambios en el estilo de vida  Entre ellos, realizar sesiones de ejercicios aerbicos durante, por lo menos, , 3veces por semana. Por ejemplo, caminar, andar en bicicleta y nadar. Los ejercicios aerbicos junto con una dieta sana pueden ayudar a que se Dietitian en un peso saludable.  Los cambios tambin podran incluir dejar de fumar. Medicamentos  Por lo general, se  administran medicamentos si con los Mudlogger y en el estilo de vida no se logra reducir el colesterol hasta niveles saludables.  El mdico podra recetarle estatinas. Se ha demostrado que las estatinas [employer] niveles de [place], lo que puede reducir el riesgo de Marine scientist una enfermedad cardaca. Siga estas indicaciones en su casa: Comida y bebida  Si se lo indic el mdico:  Coma pollo (sin piel), pescado, ternera, mariscos, pechuga de [place] y cortes de carne roja de pulpa o de lomo.  No coma alimentos fritos ni carnes grasosas, como salchichas y salame.  Coma muchas frutas, como manzanas.  Coma gran cantidad de verduras, como brcoli, papas y zanahorias.  Coma porotos, guisantes secos y lentejas.  Coma cereales, como cebada, arroz, cuscs y trigo burgol.  Coma pastas sin salsas con crema.  Tome [employer] o semidescremada, y coma yogures y quesos  descremados o semidescremados.  No coma ni beba [employer], crema, helado, yemas de huevo ni quesos duros.  No coma margarinas en barra ni untables que contengan grasas trans (que tambin se conocen como aceites parcialmente hidrogenados).  No coma aceites tropicales saturados, como el de coco y el de [place].  No coma tortas, galletas, galletitas ni otros productos horneados que contengan grasas trans.  Instrucciones generales  Haga ejercicio segn las indicaciones del mdico. Aumente la cantidad de ejercicio fsico que realiza mediante actividades como jardinera, salir a Advertising account planner o usar las escaleras.  Tome los medicamentos de venta libre y los recetados solamente como se lo haya indicado el mdico.  No consuma ningn producto que contenga nicotina o tabaco, como cigarrillos y Administrator, Civil Service. Si necesita ayuda para dejar de fumar, consulte al mdico.  Concurra a todas las visitas de control como se lo haya indicado el mdico. Esto es importante. Comunquese con un mdico si:  Tiene dificultad para seguir una dieta sana o mantener un peso saludable.  Necesita ayuda para comenzar un programa de ejercicios.  Necesita ayuda para dejar de fumar. Solicite ayuda de inmediato si:  Midwife.  Tiene dificultad para respirar. Esta informacin no tiene Theme park manager el consejo del mdico. Asegrese de hacerle al mdico cualquier pregunta que tenga. Document Released: 09/09/2005 Document Revised: 12/16/2016 Document Reviewed: 03/09/2016 Elsevier Interactive Patient Education  [employer].

## 2018-03-16 NOTE — Progress Notes (Signed)
160 

## 2018-03-16 NOTE — Progress Notes (Signed)
Assessment & Plan:  Mark Curtis was seen today for establish care and medication refill.  Diagnoses and all orders for this visit:  Uncontrolled type 2 diabetes mellitus with hyperglycemia (HCC) -     Glucose (CBG) -     HgB A1c -     metFORMIN (GLUCOPHAGE) 1000 MG tablet; Take 1 tablet (1,000 mg total) by mouth 2 (two) times daily with a meal. -     TRUEPLUS LANCETS 28G MISC; Use as instructed -     glucose blood (TRUE METRIX BLOOD GLUCOSE TEST) test strip; Use as instructed Continue blood sugar control as discussed in office today, low carbohydrate diet, and regular physical exercise as tolerated, 150 minutes per week (30 min each day, 5 days per week, or 50 min 3 days per week). Keep blood sugar logs with fasting goal of 80-130 mg/dl, post prandial less than 180.  For Hypoglycemia: BS <60 and Hyperglycemia BS >400; contact the clinic ASAP. Annual eye exams and foot exams are recommended.  Mixed hyperlipidemia -     atorvastatin (LIPITOR) 40 MG tablet; Take 1 tablet (40 mg total) by mouth daily at 6 PM. INSTRUCTIONS: Work on a low fat, heart healthy diet and participate in regular aerobic exercise program by working out at least 150 minutes per week. No fried foods. No junk foods, sodas, sugary drinks, unhealthy snacking, alcohol or smoking.      Patient has been counseled on age-appropriate routine health concerns for screening and prevention. These are reviewed and up-to-date. Referrals have been placed accordingly. Immunizations are up-to-date or declined.    Subjective:   Chief Complaint  Patient presents with  . Establish Care    Pt. is here to establish care for diabetes.   . Medication Refill   HPI Donato Schultzablo [place] 62 y.o. male presents to office today to establish care. VRI was used to communicate directly with patient for the entire encounter including providing detailed patient instructions. He has a history of DM and HPL.   DM Type 2 Chronic. A1c down from 12.7 to 7.7 today.  Weight is stable. Well controlled on metformin 1000 mg BID. Will continue on the same medication. He stopped taking glipizide as it caused extreme fatigue. He checks his blood sugars in the morning. Average readings: FBS 130-180. Taking lisinopril for renal protection. Denies any hypo or hyperglycemic symptoms. Overdue for eye exam. Resources for local ophthalmologists given.  Lab Results  Component Value Date   HGBA1C 7.7 (A) 03/16/2018    Hyperlipidemia Patient presents for follow up to hyperlipidemia.  He is medication compliant taking atorvastatin 40mg  daily as prescribed. He is not diet compliant and denies chest pain, shortness of breath,  skin xanthelasma or statin intolerance including myalgias.  Lab Results  Component Value Date   CHOL 204 (H) 08/11/2017   Lab Results  Component Value Date   HDL 32 (L) 08/11/2017   Lab Results  Component Value Date   LDLCALC 132 (H) 08/11/2017   Lab Results  Component Value Date   TRIG 200 (H) 08/11/2017   Lab Results  Component Value Date   CHOLHDL 6.4 (H) 08/11/2017   No results found for: LDLDIRECT    Review of Systems  Constitutional: Negative for fever, malaise/fatigue and weight loss.  HENT: Negative.  Negative for nosebleeds.   Eyes: Negative.  Negative for blurred vision, double vision and photophobia.  Respiratory: Negative.  Negative for cough and shortness of breath.   Cardiovascular: Negative.  Negative for chest pain, palpitations  and leg swelling.  Gastrointestinal: Negative.  Negative for heartburn, nausea and vomiting.  Musculoskeletal: Negative.  Negative for myalgias.  Neurological: Negative.  Negative for dizziness, focal weakness, seizures and headaches.  Psychiatric/Behavioral: Negative.  Negative for suicidal ideas.    Past Medical History:  Diagnosis Date  . Chest pain   . Chronic headaches   . DM (diabetes mellitus) (HCC)   . Hyperglycemia   . Polydipsia   . Polyuria   . Spell of dizziness    WHEN  STANDING  . Vomiting     History reviewed. No pertinent surgical history.  Family History  Problem Relation Age of Onset  . Diabetes Father     Social History Reviewed with no changes to be made today.   Outpatient Medications Prior to Visit  Medication Sig Dispense Refill  . metFORMIN (GLUCOPHAGE) 1000 MG tablet Take 1 tablet (1,000 mg total) by mouth 2 (two) times daily with a meal. 60 tablet 0  . albuterol (PROVENTIL HFA;VENTOLIN HFA) 108 (90 Base) MCG/ACT inhaler Inhale 2 puffs into the lungs every 4 (four) hours as needed for wheezing or shortness of breath. (Patient not taking: Reported on 09/04/2017) 1 Inhaler 3  . aspirin EC 81 MG tablet Take 1 tablet (81 mg total) by mouth daily. (Patient not taking: Reported on 03/16/2018) 30 tablet 5  . atorvastatin (LIPITOR) 40 MG tablet Take 1 tablet (40 mg total) daily by mouth. 90 tablet 3  . glipiZIDE (GLUCOTROL) 5 MG tablet Take 1 tablet (5 mg total) by mouth daily before breakfast. (Patient not taking: Reported on 03/16/2018) 30 tablet 2  . glucose blood test strip Use as instructed (Patient not taking: Reported on 03/16/2018) 100 each 12  . lisinopril (PRINIVIL,ZESTRIL) 2.5 MG tablet Take 1 tablet (2.5 mg total) by mouth daily. 90 tablet 1   No facility-administered medications prior to visit.     No Known Allergies     Objective:    BP 125/83 (BP Location: Right Arm, Patient Position: Sitting, Cuff Size: Normal)   Pulse 83   Temp 98.2 F (36.8 C) (Oral)   Ht 5\' 11"  (1.803 m)   Wt 199 lb 6.4 oz (90.4 kg)   SpO2 97%   BMI 27.81 kg/m  Wt Readings from Last 3 Encounters:  03/16/18 199 lb 6.4 oz (90.4 kg)  09/04/17 197 lb 12.8 oz (89.7 kg)  08/13/17 195 lb 6.4 oz (88.6 kg)    Physical Exam  Constitutional: He is oriented to person, place, and time. He appears well-developed and well-nourished. He is cooperative.  HENT:  Head: Normocephalic and atraumatic.  Eyes: EOM are normal.  Neck: Normal range of motion.    Cardiovascular: Normal rate, regular rhythm and normal heart sounds. Exam reveals no gallop and no friction rub.  No murmur heard. Pulmonary/Chest: Effort normal and breath sounds normal. No tachypnea. No respiratory distress. He has no decreased breath sounds. He has no wheezes. He has no rhonchi. He has no rales. He exhibits no tenderness.  Abdominal: Bowel sounds are normal.  Musculoskeletal: Normal range of motion. He exhibits no edema.  Neurological: He is alert and oriented to person, place, and time. Coordination normal.  Skin: Skin is warm and dry.  Psychiatric: He has a normal mood and affect. His behavior is normal. Judgment and thought content normal.  Nursing note and vitals reviewed.        Patient has been counseled extensively about nutrition and exercise as well as the importance of adherence with medications  and regular follow-up. The patient was given clear instructions to go to ER or return to medical center if symptoms don't improve, worsen or new problems develop. The patient verbalized understanding.   Follow-up: Return in about 3 months (around 06/16/2018) for Fasting labs, HPL/DM.   Claiborne Rigg, FNP-BC [facility] [place], [place] [phone]   03/18/2018, 10:33 PM

## 2018-03-18 ENCOUNTER — Encounter: Payer: Self-pay | Admitting: Nurse Practitioner

## 2018-04-07 MED FILL — TRUEplus LANCETS 28G MISC: 30 days supply | Qty: 100 | Fill #0

## 2018-04-07 MED FILL — ATORVASTATIN 40 MG TABLET: 40 | 30 days supply | Qty: 30 | Fill #0

## 2018-04-07 MED FILL — LISINOPRIL 2.5 MG TABLET: 2.5 | 30 days supply | Qty: 30 | Fill #0

## 2018-04-07 MED FILL — metFORMIN HCL 1000 MG TABS: 1000 | 30 days supply | Qty: 60 | Fill #0

## 2018-04-07 MED FILL — TRUE METRIX TEST STRIP: 30 days supply | Qty: 100 | Fill #0

## 2018-05-08 MED FILL — metFORMIN HCL 1000 MG TABS: 1000 | 30 days supply | Qty: 60 | Fill #2

## 2018-05-08 MED FILL — ATORVASTATIN 40 MG TABLET: 40 | 30 days supply | Qty: 30 | Fill #1

## 2018-05-08 MED FILL — TRUEplus LANCETS 28G MISC: 30 days supply | Qty: 100 | Fill #1

## 2018-05-08 MED FILL — LISINOPRIL 2.5 MG TABLET: 2.5 | 30 days supply | Qty: 30 | Fill #1

## 2018-06-19 ENCOUNTER — Ambulatory Visit: Payer: Self-pay | Attending: Nurse Practitioner | Admitting: Nurse Practitioner

## 2018-06-19 ENCOUNTER — Encounter: Payer: Self-pay | Admitting: Nurse Practitioner

## 2018-06-19 VITALS — BP 131/82 | HR 72 | Temp 98.0°F | Ht 71.0 in | Wt 195.8 lb

## 2018-06-19 DIAGNOSIS — Z0000 Encounter for general adult medical examination without abnormal findings: Secondary | ICD-10-CM

## 2018-06-19 DIAGNOSIS — E782 Mixed hyperlipidemia: Secondary | ICD-10-CM | POA: Insufficient documentation

## 2018-06-19 DIAGNOSIS — E1165 Type 2 diabetes mellitus with hyperglycemia: Secondary | ICD-10-CM | POA: Insufficient documentation

## 2018-06-19 DIAGNOSIS — Z7984 Long term (current) use of oral hypoglycemic drugs: Secondary | ICD-10-CM | POA: Insufficient documentation

## 2018-06-19 DIAGNOSIS — Z833 Family history of diabetes mellitus: Secondary | ICD-10-CM | POA: Insufficient documentation

## 2018-06-19 DIAGNOSIS — Z79899 Other long term (current) drug therapy: Secondary | ICD-10-CM | POA: Insufficient documentation

## 2018-06-19 DIAGNOSIS — Z1211 Encounter for screening for malignant neoplasm of colon: Secondary | ICD-10-CM | POA: Insufficient documentation

## 2018-06-19 LAB — POCT GLYCOSYLATED HEMOGLOBIN (HGB A1C): HEMOGLOBIN A1C: 7.4 % — AB (ref 4.0–5.6)

## 2018-06-19 LAB — GLUCOSE, POCT (MANUAL RESULT ENTRY): POC GLUCOSE: 202 mg/dL — AB (ref 70–99)

## 2018-06-19 MED ORDER — TRUEPLUS LANCETS 28G MISC: 3 refills | Status: DC

## 2018-06-19 MED ORDER — LISINOPRIL 2.5 MG PO TABS: 2.5000 mg | ORAL_TABLET | Freq: Every day | ORAL | 1 refills | Status: DC

## 2018-06-19 MED ORDER — ATORVASTATIN CALCIUM 40 MG PO TABS: 40.0000 mg | ORAL_TABLET | Freq: Every day | ORAL | 3 refills | Status: DC

## 2018-06-19 MED ORDER — SITAGLIPTIN PHOS-METFORMIN HCL 50-1000 MG PO TABS: 1.0000 | ORAL_TABLET | Freq: Two times a day (BID) | ORAL | 1 refills | Status: DC

## 2018-06-19 MED ORDER — GLUCOSE BLOOD VI STRP: ORAL_STRIP | 12 refills | Status: DC

## 2018-06-19 MED FILL — TRUEplus LANCETS 28G MISC: 25 days supply | Qty: 100 | Fill #0

## 2018-06-19 MED FILL — JANUMET 50-1,000 MG TABLET: 50-1000 | 30 days supply | Qty: 60 | Fill #0

## 2018-06-19 MED FILL — ATORVASTATIN CALCIUM 40 MG: 40 | 30 days supply | Qty: 30 | Fill #0

## 2018-06-19 MED FILL — LISINOPRIL 2.5 MG TABLET: 2.5 | 30 days supply | Qty: 30 | Fill #0

## 2018-06-19 MED FILL — TRUE METRIX TEST STRIP: 25 days supply | Qty: 100 | Fill #0

## 2018-06-19 NOTE — Patient Instructions (Signed)

## 2018-06-19 NOTE — Progress Notes (Addendum)
Assessment & Plan:  Mark Curtis was seen today for follow-up.  Diagnoses and all orders for this visit:  Uncontrolled type 2 diabetes mellitus with hyperglycemia (HCC) -     Glucose (CBG) -     HgB A1c -     Lipid panel -     CMP14+EGFR -     CBC -     Microalbumin/Creatinine Ratio, Urine -     lisinopril (PRINIVIL,ZESTRIL) 2.5 MG tablet; Take 1 tablet (2.5 mg total) by mouth daily. -     sitaGLIPtin-metformin (JANUMET) 50-1000 MG tablet; Take 1 tablet by mouth 2 (two) times daily with a meal. -     glucose blood (TRUE METRIX BLOOD GLUCOSE TEST) test strip; Use as instructed -     TRUEPLUS LANCETS 28G MISC; Use as instructed Continue blood sugar control as discussed in office today, low carbohydrate diet, and regular physical exercise as tolerated, 150 minutes per week (30 min each day, 5 days per week, or 50 min 3 days per week). Keep blood sugar logs with fasting goal of 90-130 mg/dl, post prandial (after you eat) less than 180.  For Hypoglycemia: BS <60 and Hyperglycemia BS >400; contact the clinic ASAP. Annual eye exams and foot exams are recommended.   Routine adult health maintenance -     Hepatitis C Antibody  Colon cancer screening -     Fecal occult blood, imunochemical  Mixed hyperlipidemia -     atorvastatin (LIPITOR) 40 MG tablet; Take 1 tablet (40 mg total) by mouth daily at 6 PM. INSTRUCTIONS: Work on a low fat, heart healthy diet and participate in regular aerobic exercise program by working out at least 150 minutes per week; 5 days a week-30 minutes per day. Avoid red meat, fried foods. junk foods, sodas, sugary drinks, unhealthy snacking, alcohol and smoking.  Drink at least 48oz of water per day and monitor your carbohydrate intake daily.    Patient has been counseled on age-appropriate routine health concerns for screening and prevention. These are reviewed and up-to-date. Referrals have been placed accordingly. Immunizations are up-to-date or declined.      Subjective:   Chief Complaint  Patient presents with  . Follow-up    Pt. is here to follow-up on diabetes.    HPI Mark Curtis 62 y.o. male presents to office today for follow up. VRI was used to communicate directly with patient for the entire encounter including providing detailed patient instructions. He is accompanied by his wife today.    Type 2 Diabetes Mellitus Disease course has been improving. There are no hypoglycemic symptoms. There are no hypoglycemic complications. Symptoms are stable. There are  nodiabetic complications. Risk factors for coronary artery disease include dyslipidemia, diabetes mellitus. Current diabetic treatment includes metformin 1000 mg BID. Patient is compliant with treatment all of the time and monitors blood glucose at home 2 times per day.   Home blood glucose trend : (FBS 140-175 mg/dl)  Weight is  stable. Patient . Meal planning includes avoidance of concentrated sweets however he is still eating foods like pasta, tortillas, and other high carb foods. We had a long discussion regarding dietary modifications today.  Patient has not seen a dietician. Patient is not compliant with exercise.   An ACE inhibitor/angiotensin II receptor blocker is being taken. Patient does not see a podiatrist. Eye exam is not current. A1c is improved however I will switch metformin to janumet.   Lab Results  Component Value Date   HGBA1C 7.4 (A) 06/19/2018  Lab Results  Component Value Date   HGBA1C 7.7 (A) 03/16/2018    Hyperlipidemia Patient presents for follow up to hyperlipidemia.  He is medication compliant. He is diet compliant and denies skin xanthelasma or statin intolerance including myalgias.  Lab Results  Component Value Date   CHOL 204 (H) 08/11/2017   Lab Results  Component Value Date   HDL 32 (L) 08/11/2017   Lab Results  Component Value Date   LDLCALC 132 (H) 08/11/2017   Lab Results  Component Value Date   TRIG 200 (H) 08/11/2017   Lab Results   Component Value Date   CHOLHDL 6.4 (H) 08/11/2017   Review of Systems  Constitutional: Negative for fever, malaise/fatigue and weight loss.  HENT: Positive for hearing loss (right ear). Negative for nosebleeds.   Eyes: Negative.  Negative for blurred vision, double vision and photophobia.  Respiratory: Negative.  Negative for cough and shortness of breath.   Cardiovascular: Negative.  Negative for chest pain, palpitations and leg swelling.  Gastrointestinal: Negative.  Negative for heartburn, nausea and vomiting.  Musculoskeletal: Negative.  Negative for myalgias.  Neurological: Negative.  Negative for dizziness, focal weakness, seizures and headaches.  Psychiatric/Behavioral: Negative.  Negative for suicidal ideas.    Past Medical History:  Diagnosis Date  . Chest pain   . Chronic headaches   . DM (diabetes mellitus) ([place])   . Hyperglycemia   . Polydipsia   . Polyuria   . Spell of dizziness    WHEN STANDING  . Vomiting     History reviewed. No pertinent surgical history.  Family History  Problem Relation Age of Onset  . Diabetes Father     Social History Reviewed with no changes to be made today.   Outpatient Medications Prior to Visit  Medication Sig Dispense Refill  . glucose blood (TRUE METRIX BLOOD GLUCOSE TEST) test strip Use as instructed 100 each 12  . metFORMIN (GLUCOPHAGE) 1000 MG tablet Take 1 tablet (1,000 mg total) by mouth 2 (two) times daily with a meal. 60 tablet 0  . TRUEPLUS LANCETS 28G MISC Use as instructed 100 each 3  . albuterol (PROVENTIL HFA;VENTOLIN HFA) 108 (90 Base) MCG/ACT inhaler Inhale 2 puffs into the lungs every 4 (four) hours as needed for wheezing or shortness of breath. (Patient not taking: Reported on 09/04/2017) 1 Inhaler 3  . aspirin EC 81 MG tablet Take 1 tablet (81 mg total) by mouth daily. (Patient not taking: Reported on 03/16/2018) 30 tablet 5  . atorvastatin (LIPITOR) 40 MG tablet Take 1 tablet (40 mg total) by mouth daily at 6  PM. 90 tablet 3  . lisinopril (PRINIVIL,ZESTRIL) 2.5 MG tablet Take 1 tablet (2.5 mg total) by mouth daily. 90 tablet 1   No facility-administered medications prior to visit.     No Known Allergies     Objective:    BP 131/82 (BP Location: Right Arm, Patient Position: Sitting, Cuff Size: Normal)   Pulse 72   Temp 98 F (36.7 C) (Oral)   Ht _0  (1.803 m)   Wt 195 lb 12.8 oz (88.8 kg)   SpO2 95%   BMI 27.31 kg/m  Wt Readings from Last 3 Encounters:  06/19/18 195 lb 12.8 oz (88.8 kg)  03/16/18 199 lb 6.4 oz (90.4 kg)  09/04/17 197 lb 12.8 oz (89.7 kg)    Physical Exam  Constitutional: He is oriented to person, place, and time. He appears well-developed and well-nourished. He is cooperative.  HENT:  Head: Normocephalic  and atraumatic.  Right Ear: Tympanic membrane is scarred. Decreased hearing is noted.  Eyes: EOM are normal.  Neck: Normal range of motion.  Cardiovascular: Normal rate, regular rhythm and normal heart sounds. Exam reveals no gallop and no friction rub.  No murmur heard. Pulmonary/Chest: Effort normal and breath sounds normal. No tachypnea. No respiratory distress. He has no decreased breath sounds. He has no wheezes. He has no rhonchi. He has no rales. He exhibits no tenderness.  Abdominal: Bowel sounds are normal.  Musculoskeletal: Normal range of motion. He exhibits no edema.  Neurological: He is alert and oriented to person, place, and time. Coordination normal.  Skin: Skin is warm and dry.  Psychiatric: He has a normal mood and affect. His behavior is normal. Judgment and thought content normal.  Nursing note and vitals reviewed.       Patient has been counseled extensively about nutrition and exercise as well as the importance of adherence with medications and regular follow-up. The patient was given clear instructions to go to ER or return to medical center if symptoms don't improve, worsen or new problems develop. The patient verbalized  understanding.   Follow-up: Return in about 3 months (around 09/18/2018) for DM/HPL; , Needs appointment with financial representative.Gildardo Pounds, FNP-BC [facility] [place], [place]   06/19/2018, 9:13 AM

## 2018-06-20 LAB — CMP14+EGFR
ALK PHOS: 151 IU/L — AB (ref 39–117)
ALT: 27 IU/L (ref 0–44)
AST: 10 IU/L (ref 0–40)
Albumin/Globulin Ratio: 1.9 (ref 1.2–2.2)
Albumin: 5 g/dL — ABNORMAL HIGH (ref 3.6–4.8)
BILIRUBIN TOTAL: 0.6 mg/dL (ref 0.0–1.2)
BUN / CREAT RATIO: 9 — AB (ref 10–24)
BUN: 7 mg/dL — ABNORMAL LOW (ref 8–27)
CHLORIDE: 95 mmol/L — AB (ref 96–106)
CO2: 26 mmol/L (ref 20–29)
CREATININE: 0.74 mg/dL — AB (ref 0.76–1.27)
Calcium: 10.5 mg/dL — ABNORMAL HIGH (ref 8.6–10.2)
GFR calc non Af Amer: 100 mL/min/{1.73_m2} (ref >59)
GFR, EST AFRICAN AMERICAN: 115 mL/min/{1.73_m2} (ref >59)
GLUCOSE: 160 mg/dL — AB (ref 65–99)
Globulin, Total: 2.7 g/dL (ref 1.5–4.5)
Potassium: 4.8 mmol/L (ref 3.5–5.2)
SODIUM: 138 mmol/L (ref 134–144)
Total Protein: 7.7 g/dL (ref 6.0–8.5)

## 2018-06-20 LAB — LIPID PANEL
Chol/HDL Ratio: 4.9 ratio (ref 0.0–5.0)
Cholesterol, Total: 143 mg/dL (ref 100–199)
HDL: 29 mg/dL — ABNORMAL LOW (ref >39)
LDL CALC: 63 mg/dL (ref 0–99)
Triglycerides: 255 mg/dL — ABNORMAL HIGH (ref 0–149)
VLDL CHOLESTEROL CAL: 51 mg/dL — AB (ref 5–40)

## 2018-06-20 LAB — CBC
HEMATOCRIT: 43.7 % (ref 37.5–51.0)
Hemoglobin: 14.5 g/dL (ref 13.0–17.7)
MCH: 32.1 pg (ref 26.6–33.0)
MCHC: 33.2 g/dL (ref 31.5–35.7)
MCV: 97 fL (ref 79–97)
PLATELETS: 304 10*3/uL (ref 150–450)
RBC: 4.52 x10E6/uL (ref 4.14–5.80)
RDW: 12.3 % (ref 12.3–15.4)
WBC: 11.8 10*3/uL — ABNORMAL HIGH (ref 3.4–10.8)

## 2018-06-20 LAB — HEPATITIS C ANTIBODY: Hep C Virus Ab: <0.1 s/co ratio (ref 0.0–0.9)

## 2018-06-20 LAB — MICROALBUMIN / CREATININE URINE RATIO
Creatinine, Urine: 94.4 mg/dL
Microalb/Creat Ratio: 19.2 mg/g creat (ref 0.0–30.0)
Microalbumin, Urine: 18.1 ug/mL

## 2018-06-24 ENCOUNTER — Telehealth: Payer: Self-pay

## 2018-06-24 NOTE — Telephone Encounter (Signed)
-----   Message from Claiborne Rigg, NP sent at 06/21/2018  7:26 PM EDT ----- Cholesterol levels are elevated. Please make sure you are taking your pravastatin cholesterol medication every day. High cholesterol levels increase your risk of stroke.   You have a few other labs that are slightly outside the ranges of normal however they do not require any additional work up at this time. Will continue to monitor. Make sure you are drinking at least 48 oz of water per day. Work on eating a low fat, heart healthy diet and participate in regular aerobic exercise program to control as well. Exercise at least  30 minutes per day-5 days per week. Avoid red meat. No fried foods. No junk foods, sodas, sugary foods or drinks, unhealthy snacking, alcohol or smoking.

## 2018-06-25 NOTE — Telephone Encounter (Signed)
CMA attempt to reach patient to inform on results.   No answer and left a VM for patient to call back.  If patient call back, please inform:  Cholesterol levels are elevated. Please make sure you are taking your pravastatin cholesterol medication every day. High cholesterol levels increase your risk of stroke.   You have a few other labs that are slightly outside the ranges of normal however they do not require any additional work up at this time. Will continue to monitor. Make sure you are drinking at least 48 oz of water per day. Work on eating a low fat, heart healthy diet and participate in regular aerobic exercise program to control as well. Exercise at least  30 minutes per day-5 days per week. Avoid red meat. No fried foods. No junk foods, sodas, sugary foods or drinks, unhealthy snacking, alcohol or smoking.     A letter will be send out to reach patient.

## 2018-06-25 NOTE — Telephone Encounter (Deleted)
-----   Message from Claiborne Rigg, NP sent at 06/21/2018  7:26 PM EDT ----- Cholesterol levels are elevated. Please make sure you are taking your pravastatin cholesterol medication every day. High cholesterol levels increase your risk of stroke.   You have a few other labs that are slightly outside the ranges of normal however they do not require any additional work up at this time. Will continue to monitor. Make sure you are drinking at least 48 oz of water per day. Work on eating a low fat, heart healthy diet and participate in regular aerobic exercise program to control as well. Exercise at least  30 minutes per day-5 days per week. Avoid red meat. No fried foods. No junk foods, sodas, sugary foods or drinks, unhealthy snacking, alcohol or smoking.

## 2019-08-11 ENCOUNTER — Other Ambulatory Visit: Payer: Self-pay

## 2019-08-11 DIAGNOSIS — Z20822 Contact with and (suspected) exposure to covid-19: Secondary | ICD-10-CM

## 2019-08-13 LAB — NOVEL CORONAVIRUS, NAA: SARS-CoV-2, NAA: NOT DETECTED

## 2020-10-02 ENCOUNTER — Other Ambulatory Visit: Payer: Self-pay

## 2021-09-01 ENCOUNTER — Observation Stay (HOSPITAL_COMMUNITY)
Admission: EM | Admit: 2021-09-01 | Discharge: 2021-09-03 | Disposition: A | Discharge status: Home / Self Care | Payer: Self-pay | Attending: Internal Medicine | Admitting: Internal Medicine

## 2021-09-01 ENCOUNTER — Other Ambulatory Visit: Payer: Self-pay

## 2021-09-01 ENCOUNTER — Observation Stay (HOSPITAL_COMMUNITY): Payer: Self-pay

## 2021-09-01 ENCOUNTER — Emergency Department (HOSPITAL_COMMUNITY): Payer: Self-pay

## 2021-09-01 ENCOUNTER — Encounter (HOSPITAL_COMMUNITY): Payer: Self-pay | Admitting: Emergency Medicine

## 2021-09-01 DIAGNOSIS — Z720 Tobacco use: Secondary | ICD-10-CM | POA: Diagnosis present

## 2021-09-01 DIAGNOSIS — F17210 Nicotine dependence, cigarettes, uncomplicated: Secondary | ICD-10-CM | POA: Insufficient documentation

## 2021-09-01 DIAGNOSIS — E782 Mixed hyperlipidemia: Secondary | ICD-10-CM

## 2021-09-01 DIAGNOSIS — J441 Chronic obstructive pulmonary disease with (acute) exacerbation: Secondary | ICD-10-CM

## 2021-09-01 DIAGNOSIS — Z7982 Long term (current) use of aspirin: Secondary | ICD-10-CM | POA: Insufficient documentation

## 2021-09-01 DIAGNOSIS — Z79899 Other long term (current) drug therapy: Secondary | ICD-10-CM | POA: Insufficient documentation

## 2021-09-01 DIAGNOSIS — E1165 Type 2 diabetes mellitus with hyperglycemia: Secondary | ICD-10-CM

## 2021-09-01 DIAGNOSIS — Z20822 Contact with and (suspected) exposure to covid-19: Secondary | ICD-10-CM | POA: Insufficient documentation

## 2021-09-01 DIAGNOSIS — E119 Type 2 diabetes mellitus without complications: Secondary | ICD-10-CM

## 2021-09-01 DIAGNOSIS — R0600 Dyspnea, unspecified: Principal | ICD-10-CM | POA: Diagnosis present

## 2021-09-01 DIAGNOSIS — R0602 Shortness of breath: Secondary | ICD-10-CM

## 2021-09-01 DIAGNOSIS — Z7984 Long term (current) use of oral hypoglycemic drugs: Secondary | ICD-10-CM | POA: Insufficient documentation

## 2021-09-01 LAB — CBC WITH DIFFERENTIAL/PLATELET
Abs Immature Granulocytes: 0.03 10*3/uL (ref 0.00–0.07)
Basophils Absolute: 0.1 10*3/uL (ref 0.0–0.1)
Basophils Relative: 1 %
Eosinophils Absolute: 0.1 10*3/uL (ref 0.0–0.5)
Eosinophils Relative: 1 %
HCT: 44.5 % (ref 39.0–52.0)
Hemoglobin: 14.8 g/dL (ref 13.0–17.0)
Immature Granulocytes: 0 %
Lymphocytes Relative: 26 %
Lymphs Abs: 2.6 10*3/uL (ref 0.7–4.0)
MCH: 31.7 pg (ref 26.0–34.0)
MCHC: 33.3 g/dL (ref 30.0–36.0)
MCV: 95.3 fL (ref 80.0–100.0)
Monocytes Absolute: 1.6 10*3/uL — ABNORMAL HIGH (ref 0.1–1.0)
Monocytes Relative: 16 %
Neutro Abs: 5.7 10*3/uL (ref 1.7–7.7)
Neutrophils Relative %: 56 %
Platelets: 251 10*3/uL (ref 150–400)
RBC: 4.67 MIL/uL (ref 4.22–5.81)
RDW: 11.6 % (ref 11.5–15.5)
WBC: 10.1 10*3/uL (ref 4.0–10.5)
nRBC: 0 % (ref 0.0–0.2)

## 2021-09-01 LAB — RESP PANEL BY RT-PCR (FLU A&B, COVID) ARPGX2
Influenza A by PCR: NEGATIVE
Influenza B by PCR: NEGATIVE
SARS Coronavirus 2 by RT PCR: NEGATIVE

## 2021-09-01 LAB — COMPREHENSIVE METABOLIC PANEL
ALT: 33 U/L (ref 0–44)
AST: 16 U/L (ref 15–41)
Albumin: 4.7 g/dL (ref 3.5–5.0)
Alkaline Phosphatase: 105 U/L (ref 38–126)
Anion gap: 10 (ref 5–15)
BUN: 10 mg/dL (ref 8–23)
CO2: 27 mmol/L (ref 22–32)
Calcium: 9.5 mg/dL (ref 8.9–10.3)
Chloride: 98 mmol/L (ref 98–111)
Creatinine, Ser: 0.89 mg/dL (ref 0.61–1.24)
GFR, Estimated: >60 mL/min (ref >60)
Glucose, Bld: 165 mg/dL — ABNORMAL HIGH (ref 70–99)
Potassium: 4.1 mmol/L (ref 3.5–5.1)
Sodium: 135 mmol/L (ref 135–145)
Total Bilirubin: 0.7 mg/dL (ref 0.3–1.2)
Total Protein: 8.6 g/dL — ABNORMAL HIGH (ref 6.5–8.1)

## 2021-09-01 MED ORDER — METHYLPREDNISOLONE SODIUM SUCC 40 MG IJ SOLR
40.0000 mg | Freq: Two times a day (BID) | INTRAMUSCULAR | Status: DC
  Administered 2021-09-02 (×2): 40 mg via INTRAVENOUS
  Filled 2021-09-01 (×2): qty 1

## 2021-09-01 MED ORDER — ENOXAPARIN SODIUM 40 MG/0.4ML IJ SOSY
40.0000 mg | PREFILLED_SYRINGE | INTRAMUSCULAR | Status: DC
  Filled 2021-09-01 (×2): qty 0.4

## 2021-09-01 MED ORDER — INSULIN ASPART 100 UNIT/ML IJ SOLN
0.0000 [IU] | Freq: Three times a day (TID) | INTRAMUSCULAR | Status: DC
  Administered 2021-09-02: 9 [IU] via SUBCUTANEOUS
  Administered 2021-09-02 (×2): 5 [IU] via SUBCUTANEOUS
  Administered 2021-09-03 (×2): 3 [IU] via SUBCUTANEOUS

## 2021-09-01 MED ORDER — ONDANSETRON HCL 4 MG/2ML IJ SOLN: 4.0000 mg | Freq: Four times a day (QID) | INTRAMUSCULAR | Status: DC | PRN

## 2021-09-01 MED ORDER — ALBUTEROL SULFATE (2.5 MG/3ML) 0.083% IN NEBU: 2.5000 mg | INHALATION_SOLUTION | RESPIRATORY_TRACT | Status: DC | PRN

## 2021-09-01 MED ORDER — SODIUM CHLORIDE 0.9% FLUSH
3.0000 mL | Freq: Two times a day (BID) | INTRAVENOUS | Status: DC
  Administered 2021-09-01 – 2021-09-03 (×4): 3 mL via INTRAVENOUS

## 2021-09-01 MED ORDER — GUAIFENESIN ER 600 MG PO TB12
600.0000 mg | ORAL_TABLET | Freq: Two times a day (BID) | ORAL | Status: DC
  Administered 2021-09-01 – 2021-09-03 (×4): 600 mg via ORAL
  Filled 2021-09-01 (×4): qty 1

## 2021-09-01 MED ORDER — ONDANSETRON HCL 4 MG PO TABS: 4.0000 mg | ORAL_TABLET | Freq: Four times a day (QID) | ORAL | Status: DC | PRN

## 2021-09-01 MED ORDER — ACETAMINOPHEN 650 MG RE SUPP: 650.0000 mg | Freq: Four times a day (QID) | RECTAL | Status: DC | PRN

## 2021-09-01 MED ORDER — IPRATROPIUM-ALBUTEROL 0.5-2.5 (3) MG/3ML IN SOLN
3.0000 mL | Freq: Once | RESPIRATORY_TRACT | Status: AC
  Administered 2021-09-01: 3 mL via RESPIRATORY_TRACT
  Filled 2021-09-01: qty 3

## 2021-09-01 MED ORDER — NICOTINE 21 MG/24HR TD PT24
21.0000 mg | MEDICATED_PATCH | Freq: Every day | TRANSDERMAL | Status: DC
  Administered 2021-09-01 – 2021-09-03 (×3): 21 mg via TRANSDERMAL
  Filled 2021-09-01 (×3): qty 1

## 2021-09-01 MED ORDER — IPRATROPIUM-ALBUTEROL 0.5-2.5 (3) MG/3ML IN SOLN
3.0000 mL | Freq: Four times a day (QID) | RESPIRATORY_TRACT | Status: DC
  Administered 2021-09-02 – 2021-09-03 (×7): 3 mL via RESPIRATORY_TRACT
  Filled 2021-09-01 (×7): qty 3

## 2021-09-01 MED ORDER — IOHEXOL 350 MG/ML SOLN
75.0000 mL | Freq: Once | INTRAVENOUS | Status: AC | PRN
  Administered 2021-09-01: 75 mL via INTRAVENOUS

## 2021-09-01 MED ORDER — SENNOSIDES-DOCUSATE SODIUM 8.6-50 MG PO TABS: 1.0000 | ORAL_TABLET | Freq: Every evening | ORAL | Status: DC | PRN

## 2021-09-01 MED ORDER — ACETAMINOPHEN 325 MG PO TABS: 650.0000 mg | ORAL_TABLET | Freq: Four times a day (QID) | ORAL | Status: DC | PRN

## 2021-09-01 MED ORDER — METHYLPREDNISOLONE SODIUM SUCC 125 MG IJ SOLR
125.0000 mg | Freq: Once | INTRAMUSCULAR | Status: AC
  Administered 2021-09-01: 125 mg via INTRAVENOUS
  Filled 2021-09-01: qty 2

## 2021-09-01 NOTE — ED Provider Notes (Signed)
Mark Curtis Provider Note   CSN: [id number] Arrival date & time: 09/01/21  1407     History Chief Complaint  Patient presents with   Shortness of Breath    Mark Curtis is a 65 y.o. male.  65 year old male with prior medical history as detailed below presents for evaluation.  Patient is here with his grandson who provides translation services.  Patient is comfortable with his grandson translating for him.  Patient reports cough and congestion.  This is associated with wheezing.  Symptoms been ongoing for the last day or 2.  Symptoms are worse with exertion.  Patient does use an albuterol MDI at home.  He has a long history of smoking.  He denies any recent fever.  He denies productive cough.  He denies any recent use of antibiotics or steroids.  The history is provided by the patient. A language interpreter was used.  Shortness of Breath Severity:  Moderate Onset quality:  Gradual Duration:  2 days Timing:  Constant Progression:  Worsening Chronicity:  New Context: activity   Relieved by:  Nothing Worsened by:  Nothing Associated symptoms: wheezing       Past Medical History:  Diagnosis Date   Chest pain    Chronic headaches    DM (diabetes mellitus) (HCC)    Hyperglycemia    Polydipsia    Polyuria    Spell of dizziness    WHEN STANDING   Vomiting     Patient Active Problem List   Diagnosis Date Noted   DM (diabetes mellitus) (HCC) 07/11/2017    History reviewed. No pertinent surgical history.     Family History  Problem Relation Age of Onset   Diabetes Father     Social History   Tobacco Use   Smoking status: Some Days    Packs/day: 0.25    Years: 30.00    Pack years: 7.50    Types: Cigarettes   Smokeless tobacco: Never  Vaping Use   Vaping Use: Never used  Substance Use Topics   Alcohol use: No   Drug use: No    Home Medications Prior to Admission medications   Medication Sig Start Date End Date Taking?  Authorizing Provider  albuterol (PROVENTIL HFA;VENTOLIN HFA) 108 (90 Base) MCG/ACT inhaler Inhale 2 puffs into the lungs every 4 (four) hours as needed for wheezing or shortness of breath. Patient not taking: Reported on 09/04/2017 08/30/16   Mark Curtis, Mark Client, PA-C  aspirin EC 81 MG tablet Take 1 tablet (81 mg total) by mouth daily. Patient not taking: Reported on 03/16/2018 08/13/17   Mark Bark, FNP  atorvastatin (LIPITOR) 40 MG tablet Take 1 tablet (40 mg total) by mouth daily at 6 PM. 06/19/18 09/17/18  Mark Rigg, NP  glucose blood (TRUE METRIX BLOOD GLUCOSE TEST) test strip Use as instructed 06/19/18   Mark Rigg, NP  lisinopril (PRINIVIL,ZESTRIL) 2.5 MG tablet Take 1 tablet (2.5 mg total) by mouth daily. 06/19/18 09/17/18  Mark Rigg, NP  sitaGLIPtin-metformin (JANUMET) 50-1000 MG tablet Take 1 tablet by mouth 2 (two) times daily with a meal. 06/19/18   Mark Rigg, NP  TRUEPLUS LANCETS 28G MISC Use as instructed 06/19/18   Mark Rigg, NP    Allergies    Patient has no known allergies.  Review of Systems   Review of Systems  Respiratory:  Positive for shortness of breath and wheezing.   All other systems reviewed and are negative.  Physical Exam Updated  Vital Signs BP (!) 158/82 (BP Location: Left Arm)   Pulse (!) 122   Temp 99.1 F (37.3 C) (Oral)   Resp 18   Ht 5\' 11"  (1.803 m)   Wt 89 kg   SpO2 (!) 89%   BMI 27.37 kg/m   Physical Exam Vitals and nursing note reviewed.  Constitutional:      General: He is not in acute distress.    Appearance: Normal appearance. He is well-developed.  HENT:     Head: Normocephalic and atraumatic.  Eyes:     Conjunctiva/sclera: Conjunctivae normal.     Pupils: Pupils are equal, round, and reactive to light.  Cardiovascular:     Rate and Rhythm: Normal rate and regular rhythm.     Heart sounds: Normal heart sounds.  Pulmonary:     Effort: Pulmonary effort is normal. No respiratory distress.      Breath sounds: Wheezing present.     Comments: Mild diffuse expiratory wheezing throughout all lung fields Abdominal:     General: There is no distension.     Palpations: Abdomen is soft.     Tenderness: There is no abdominal tenderness.  Musculoskeletal:        General: No deformity. Normal range of motion.     Cervical back: Normal range of motion and neck supple.  Skin:    General: Skin is warm and dry.  Neurological:     General: No focal deficit present.     Mental Status: He is alert and oriented to person, place, and time.    ED Results / Procedures / Treatments   Labs (all labs ordered are listed, but only abnormal results are displayed) Labs Reviewed  RESP PANEL BY RT-PCR (FLU A&B, COVID) [id number]  CBC WITH DIFFERENTIAL/PLATELET  COMPREHENSIVE METABOLIC PANEL    EKG None  Radiology DG Chest Portable 1 View  Result Date: 09/01/2021 CLINICAL DATA:  Pt c/o SOB since this AM, reports getting winded easily. Cough, dry cough w/ some phlegm. Some nausea as well. H/o Diabetes. Smoker. cough EXAM: PORTABLE CHEST 1 VIEW COMPARISON:  06/24/2017 FINDINGS: Normal mediastinum and cardiac silhouette. Normal pulmonary vasculature. No evidence of effusion, infiltrate, or pneumothorax. No acute bony abnormality. IMPRESSION: No acute cardiopulmonary process. Electronically Signed   By: 08/24/2017 M.D.   On: 09/01/2021 15:37    Procedures Procedures   Medications Ordered in ED Medications  ipratropium-albuterol (DUONEB) 0.5-2.5 (3) MG/3ML nebulizer solution 3 mL (has no administration in time range)  methylPREDNISolone sodium succinate (SOLU-MEDROL) 125 mg/2 mL injection 125 mg (has no administration in time range)    ED Course  I have reviewed the triage vital signs and the nursing notes.  Pertinent labs & imaging results that were available during my care of the patient were reviewed by me and considered in my medical decision making (see chart for details).  Clinical  Course as of 09/01/21 2126  Sat Sep 01, 2021  2122 SpO2: 91 % [PM]    Clinical Course User Index [PM] Sep 03, 2021, MD   MDM Rules/Calculators/A&P                           MDM  MSE complete  Mark Curtis was evaluated in Emergency Department on 09/01/2021 for the symptoms described in the history of present illness. He was evaluated in the context of the global COVID-19 pandemic, which necessitated consideration that the patient might be at risk for infection with the  SARS-CoV-2 virus that causes COVID-19. Institutional protocols and algorithms that pertain to the evaluation of patients at risk for COVID-19 are in a state of rapid change based on information released by regulatory bodies including the CDC and federal and state organizations. These policies and algorithms were followed during the patient's care in the ED.   Patient with shortness of breath and hypoxia.  Initial room air sats were 88% on room air.  Presentation is consistent with likely COPD exacerbation.  Patient treated with Solu-Medrol and DuoNeb breathing treatment.  After initial treatment repeat ambulation revealed that the patient was still dyspneic and room air sats remained in the 88 to 90% range.  Patient would likely benefit from admission.  Chest x-ray is clear.  Screening labs otherwise are without significant abnormality.  Final Clinical Impression(s) / ED Diagnoses Final diagnoses:  COPD exacerbation [facility])    Rx / DC Orders ED Discharge Orders     None        Wynetta Fines, MD 09/01/21 2127

## 2021-09-01 NOTE — ED Triage Notes (Signed)
BIB son, complains of SOB since this AM, reports getting winded easily. Cough, dry cough w/ some phlegm. Denies fevers, reports nausea, denies V/D.

## 2021-09-01 NOTE — H&P (Signed)
History and Physical    Mark Curtis CBJ:[id number] DOB: [birth year] DOA: 09/01/2021  PCP: Claiborne Rigg, NP  Patient coming from: Home  I have personally briefly reviewed patient's old medical records in [facility]  Chief Complaint: Shortness of breath  HPI: Mark Curtis is a Spanish-speaking 65 y.o. male with medical history significant for type 2 diabetes, hyperlipidemia, chronic tobacco use who presented to the ED for evaluation of shortness of breath.  Patient's grandson present at bedside whom patient prefers to provide interpretation.  Patient has recent new nonproductive cough with chest congestion.  Today he began to have dyspnea with mild exertion as well as some shortness of breath at rest.  He has had some pleuritic chest discomfort with deep inspiration and abdominal wall pain with coughing.  He denies any subjective fevers, chills, diaphoresis, nausea vomiting, dysuria.  He is a chronic smoker of at least a pack per day for 30 years.  He denies any known history of COPD or asthma.  ED Course:  Initial vitals showed BP 146/83, pulse 117, RR 18, temp 99.1 F, SPO2 91% on room air.  Labs showed sodium 135, potassium 4.1, bicarb 27, BUN 10, creatinine 0.89, serum glucose 165, LFTs within normal limits, WBC 10.1, hemoglobin 14.8, platelets 251,000.  SARS-CoV-2 and influenza PCR's are negative.  Portable chest x-ray is negative for focal consolidation, edema, effusion.  Patient was given IV Solu-Medrol 125 mg and DuoNeb treatment x2.  The hospitalist service was consulted to admit for dyspnea due to presumed COPD exacerbation.  Review of Systems: All systems reviewed and are negative except as documented in history of present illness above.   Past Medical History:  Diagnosis Date   Chest pain    Chronic headaches    DM (diabetes mellitus) (HCC)    Hyperglycemia    Polydipsia    Polyuria    Spell of dizziness    WHEN STANDING   Vomiting     History reviewed.  No pertinent surgical history.  Social History:  reports that he has been smoking cigarettes. He has a 7.50 pack-year smoking history. He has never used smokeless tobacco. He reports that he does not drink alcohol and does not use drugs.  No Known Allergies  Family History  Problem Relation Age of Onset   Diabetes Father      Prior to Admission medications   Medication Sig Start Date End Date Taking? Authorizing Provider  albuterol (PROVENTIL HFA;VENTOLIN HFA) 108 (90 Base) MCG/ACT inhaler Inhale 2 puffs into the lungs every 4 (four) hours as needed for wheezing or shortness of breath. 08/30/16  Yes Muthersbaugh, Dahlia Client, PA-C  gabapentin (NEURONTIN) 100 MG capsule Take 100 mg by mouth at bedtime.   Yes [provider]  glipiZIDE (GLUCOTROL) 10 MG tablet Take 10 mg by mouth daily before breakfast.   Yes [provider]  guaiFENesin (ROBITUSSIN) 100 MG/5ML liquid Take 5 mLs by mouth every 4 (four) hours as needed for cough or to loosen phlegm.   Yes [provider]  metFORMIN (GLUCOPHAGE) 1000 MG tablet Take 1,000 mg by mouth 2 (two) times daily with a meal.   Yes [provider]  aspirin EC 81 MG tablet Take 1 tablet (81 mg total) by mouth daily. Patient not taking: Reported on 09/01/2021 08/13/17   Lizbeth Bark, FNP  atorvastatin (LIPITOR) 40 MG tablet Take 1 tablet (40 mg total) by mouth daily at 6 PM. Patient not taking: Reported on 09/01/2021 06/19/18 09/17/18  Meredeth Ide,  Shea Stakes, NP  glucose blood (TRUE METRIX BLOOD GLUCOSE TEST) test strip Use as instructed 06/19/18   Claiborne Rigg, NP  lisinopril (PRINIVIL,ZESTRIL) 2.5 MG tablet Take 1 tablet (2.5 mg total) by mouth daily. Patient not taking: Reported on 09/01/2021 06/19/18 09/17/18  Claiborne Rigg, NP  sitaGLIPtin-metformin (JANUMET) 50-1000 MG tablet Take 1 tablet by mouth 2 (two) times daily with a meal. Patient not taking: Reported on 09/01/2021 06/19/18   Claiborne Rigg, NP  TRUEPLUS  LANCETS 28G MISC Use as instructed 06/19/18   Claiborne Rigg, NP    Physical Exam: Vitals:   09/01/21 2145 09/01/21 2200 09/01/21 2227 09/01/21 2246  BP: 132/79  136/74 133/76  Pulse: (!) 119  (!) 115 (!) 110  Resp: (!) 23 (!) 23 (!) 24 20  Temp:   99.3 F (37.4 C) 98.9 F (37.2 C)  TempSrc:   Oral Oral  SpO2: 93%  94% 95%  Weight:      Height:       Constitutional: Sitting up in bed, NAD, calm, comfortable Eyes: PERRL, lids and conjunctivae normal ENMT: Mucous membranes are moist. Posterior pharynx clear of any exudate or lesions.Normal dentition.  Neck: normal, supple, no masses. Respiratory: Distant breath sounds. Normal respiratory effort while on 2 L O2 via [place]. No accessory muscle use.  Cardiovascular: Tachycardic, no murmurs / rubs / gallops. No extremity edema. 2+ pedal pulses. Abdomen: no tenderness, no masses palpated. No hepatosplenomegaly. Bowel sounds positive.  Musculoskeletal: no clubbing / cyanosis. No joint deformity upper and lower extremities. Good ROM, no contractures. Normal muscle tone.  Skin: no rashes, lesions, ulcers. No induration Neurologic: CN 2-12 grossly intact. Sensation intact. Strength 5/5 in all 4.  Psychiatric: Normal judgment and insight. Alert and oriented x 3. Normal mood.   Labs on Admission: I have personally reviewed following labs and imaging studies  CBC: Recent Labs  Lab 09/01/21 1810  WBC 10.1  NEUTROABS 5.7  HGB 14.8  HCT 44.5  MCV 95.3  PLT 251   Basic Metabolic Panel: Recent Labs  Lab 09/01/21 1810  NA 135  K 4.1  CL 98  CO2 27  GLUCOSE 165*  BUN 10  CREATININE 0.89  CALCIUM 9.5   GFR: Estimated Creatinine Clearance: 89.3 mL/min (by C-G formula based on SCr of 0.89 mg/dL). Liver Function Tests: Recent Labs  Lab 09/01/21 1810  AST 16  ALT 33  ALKPHOS 105  BILITOT 0.7  PROT 8.6*  ALBUMIN 4.7   No results for input(s): LIPASE, AMYLASE in the last 168 hours. No results for input(s): AMMONIA in the last  168 hours. Coagulation Profile: No results for input(s): INR, PROTIME in the last 168 hours. Cardiac Enzymes: No results for input(s): CKTOTAL, CKMB, CKMBINDEX, TROPONINI in the last 168 hours. BNP (last 3 results) No results for input(s): PROBNP in the last 8760 hours. HbA1C: No results for input(s): HGBA1C in the last 72 hours. CBG: No results for input(s): GLUCAP in the last 168 hours. Lipid Profile: No results for input(s): CHOL, HDL, LDLCALC, TRIG, CHOLHDL, LDLDIRECT in the last 72 hours. Thyroid Function Tests: No results for input(s): TSH, T4TOTAL, FREET4, T3FREE, THYROIDAB in the last 72 hours. Anemia Panel: No results for input(s): VITAMINB12, FOLATE, FERRITIN, TIBC, IRON, RETICCTPCT in the last 72 hours. Urine analysis:    Component Value Date/Time   COLORURINE YELLOW 06/24/2017 1045   APPEARANCEUR CLEAR 06/24/2017 1045   LABSPEC 1.033 (H) 06/24/2017 1045   PHURINE 6.0 06/24/2017 1045  GLUCOSEU >=500 (A) 06/24/2017 1045   HGBUR NEGATIVE 06/24/2017 1045   BILIRUBINUR NEGATIVE 06/24/2017 1045   KETONESUR 5 (A) 06/24/2017 1045   PROTEINUR NEGATIVE 06/24/2017 1045   NITRITE NEGATIVE 06/24/2017 1045   LEUKOCYTESUR NEGATIVE 06/24/2017 1045    Radiological Exams on Admission: CT Angio Chest Pulmonary Embolism (PE) W or WO Contrast  Result Date: 09/02/2021 CLINICAL DATA:  Shortness of breath. Pulmonary embolism suspected, high probability. EXAM: CT ANGIOGRAPHY CHEST WITH CONTRAST TECHNIQUE: Multidetector CT imaging of the chest was performed using the standard protocol during bolus administration of intravenous contrast. Multiplanar CT image reconstructions and MIPs were obtained to evaluate the vascular anatomy. CONTRAST:  75mL OMNIPAQUE IOHEXOL 350 MG/ML SOLN COMPARISON:  08/29/2016. FINDINGS: Cardiovascular: The heart is normal in size and there is a trace pericardial effusion. Mild atherosclerotic calcification of the aorta without evidence of aneurysm. The pulmonary trunk  is normal in caliber. No definite evidence of pulmonary embolism. Examination is limited due to mixing artifact and respiratory motion. Mediastinum/Nodes: Shotty lymph nodes are present in the mediastinum and right hilum. No axillary lymphadenopathy. The thyroid gland, trachea, and esophagus are within normal limits. Lungs/Pleura: A few scattered tree-in-bud ground-glass nodular opacities are noted in the upper lobes bilaterally. There is a 6 mm nodule in the right middle lobe, axial image 99, unchanged. There is a 4 mm nodule in the right middle lobe, axial image 96, unchanged. No effusion or pneumothorax. Upper Abdomen: There is diffuse fatty infiltration of the liver. Stones are present within the gallbladder. Musculoskeletal: Degenerative changes are present in the thoracic spine. There is a stable sclerotic lesion at T5, likely bone island. No acute osseous abnormality. Review of the MIP images confirms the above findings. IMPRESSION: 1. No definite evidence of pulmonary embolism. Examination is limited due to mixing artifact and respiratory motion. 2. Tree-in-bud ground-glass nodular opacities in the upper lobes bilaterally, most likely infectious or inflammatory. 3. Stable right middle lobe pulmonary nodules, unchanged from 2017. 4. Hepatic steatosis. 5. Cholelithiasis. Electronically Signed   By: Thornell Sartorius M.D.   On: 09/02/2021 00:15   DG Chest Portable 1 View  Result Date: 09/01/2021 CLINICAL DATA:  Pt c/o SOB since this AM, reports getting winded easily. Cough, dry cough w/ some phlegm. Some nausea as well. H/o Diabetes. Smoker. cough EXAM: PORTABLE CHEST 1 VIEW COMPARISON:  06/24/2017 FINDINGS: Normal mediastinum and cardiac silhouette. Normal pulmonary vasculature. No evidence of effusion, infiltrate, or pneumothorax. No acute bony abnormality. IMPRESSION: No acute cardiopulmonary process. Electronically Signed   By: Genevive Bi M.D.   On: 09/01/2021 15:37    EKG: Personally reviewed.  Sinus tachycardia, rate 117, no acute ischemic changes.  Assessment/Plan Principal Problem:   Dyspnea Active Problems:   Type 2 diabetes mellitus (HCC)   Tobacco use   Mark Curtis is a Spanish-speaking 65 y.o. male with medical history significant for type 2 diabetes, hyperlipidemia, chronic tobacco use who is admitted with dyspnea presumed due to COPD exacerbation.  Dyspnea due to presumed COPD exacerbation: Patient with over 30-pack-year smoking history, no formal diagnosis of COPD but presented with dyspnea, nonproductive cough, and wheezing.  Given persistent tachycardia and dyspnea CTA chest PE study was obtained.  Study was limited due to artifact and motion but no definite evidence of PE.  Tree-in-bud groundglass nodular opacity seen in the upper lobes bilaterally. -Continue IV Solu-Medrol 40 mg twice daily -Continue scheduled DuoNebs with albuterol as needed -Continue supplemental oxygen as needed  Type 2 diabetes: Hold home metformin and  glipizide, placed on SSI.  Tobacco use: Smoking 1 pack/day at least 30 years.  Smoking cessation advised.  Nicotine patch provided.  Pulmonary nodules: Stable right middle lobe pulmonary nodules seen on CT, unchanged from 2017.  DVT prophylaxis: Lovenox Code Status: Full code, confirmed on admission Family Communication: Discussed with patient's grandson at bedside Disposition Plan: From home and likely discharge to home pending clinical progress Consults called: None Level of care: Med-Surg Admission status:   Status is: Observation  The patient remains OBS appropriate and will d/c before 2 midnights.  Darreld Mclean MD [facility]  If 7PM-7AM, please contact night-coverage [email or web address]  09/02/2021, 12:37 AM

## 2021-09-01 NOTE — ED Notes (Signed)
Pt placed on 2 liters per minute via nasal cannula due to pt's recorded SpO2 was 88% RA.

## 2021-09-01 NOTE — ED Provider Notes (Signed)
Emergency Medicine Provider Triage Evaluation Note  Mark Curtis , a 65 y.o. male  was evaluated in triage.  Pt complains of shortness of breath.  Pt has had a cough and congestion  Review of Systems  Positive: cough Negative: No fever  Physical Exam  BP (!) 146/83 (BP Location: Left Arm)   Pulse (!) 113   Temp 99.1 F (37.3 C) (Oral)   Resp 18   Ht 5\' 11"  (1.803 m)   Wt 89 kg   SpO2 91%   BMI 27.37 kg/m  Gen:   Awake, no distress   Resp:  Normal effort, decreased breath sounds MSK:   Moves extremities without difficulty  Other:    Medical Decision Making  Medically screening exam initiated at 3:04 PM.  Appropriate orders placed.  Cung Masterson was informed that the remainder of the evaluation will be completed by another provider, this initial triage assessment does not replace that evaluation, and the importance of remaining in the ED until their evaluation is complete.     Donato Schultz, Elson Areas 09/01/21 1505    14/10/22, MD 09/01/21 [phone]

## 2021-09-01 NOTE — ED Notes (Signed)
Ambulated patient without oxygen. Saturations remained around 90% room air.

## 2021-09-02 ENCOUNTER — Encounter (HOSPITAL_COMMUNITY): Payer: Self-pay | Admitting: Internal Medicine

## 2021-09-02 DIAGNOSIS — Z720 Tobacco use: Secondary | ICD-10-CM

## 2021-09-02 DIAGNOSIS — J441 Chronic obstructive pulmonary disease with (acute) exacerbation: Secondary | ICD-10-CM

## 2021-09-02 LAB — BASIC METABOLIC PANEL
Anion gap: 11 (ref 5–15)
BUN: 17 mg/dL (ref 8–23)
CO2: 25 mmol/L (ref 22–32)
Calcium: 9.2 mg/dL (ref 8.9–10.3)
Chloride: 97 mmol/L — ABNORMAL LOW (ref 98–111)
Creatinine, Ser: 0.97 mg/dL (ref 0.61–1.24)
GFR, Estimated: >60 mL/min (ref >60)
Glucose, Bld: 301 mg/dL — ABNORMAL HIGH (ref 70–99)
Potassium: 4.6 mmol/L (ref 3.5–5.1)
Sodium: 133 mmol/L — ABNORMAL LOW (ref 135–145)

## 2021-09-02 LAB — CBC
HCT: 41.8 % (ref 39.0–52.0)
Hemoglobin: 14 g/dL (ref 13.0–17.0)
MCH: 31.8 pg (ref 26.0–34.0)
MCHC: 33.5 g/dL (ref 30.0–36.0)
MCV: 95 fL (ref 80.0–100.0)
Platelets: 240 10*3/uL (ref 150–400)
RBC: 4.4 MIL/uL (ref 4.22–5.81)
RDW: 11.6 % (ref 11.5–15.5)
WBC: 7.4 10*3/uL (ref 4.0–10.5)
nRBC: 0 % (ref 0.0–0.2)

## 2021-09-02 LAB — GLUCOSE, CAPILLARY
Glucose-Capillary: 278 mg/dL — ABNORMAL HIGH (ref 70–99)
Glucose-Capillary: 381 mg/dL — ABNORMAL HIGH (ref 70–99)
Glucose-Capillary: 297 mg/dL — ABNORMAL HIGH (ref 70–99)
Glucose-Capillary: 412 mg/dL — ABNORMAL HIGH (ref 70–99)

## 2021-09-02 LAB — HEMOGLOBIN A1C
Hgb A1c MFr Bld: 7.1 % — ABNORMAL HIGH (ref 4.8–5.6)
Mean Plasma Glucose: 157.07 mg/dL

## 2021-09-02 LAB — HIV ANTIBODY (ROUTINE TESTING W REFLEX): HIV Screen 4th Generation wRfx: NONREACTIVE

## 2021-09-02 MED ORDER — LISINOPRIL 5 MG PO TABS
2.5000 mg | ORAL_TABLET | Freq: Every day | ORAL | Status: DC
  Administered 2021-09-02 – 2021-09-03 (×2): 2.5 mg via ORAL
  Filled 2021-09-02 (×2): qty 1

## 2021-09-02 MED ORDER — ATORVASTATIN CALCIUM 40 MG PO TABS
40.0000 mg | ORAL_TABLET | Freq: Every day | ORAL | Status: DC
  Administered 2021-09-02 – 2021-09-03 (×2): 40 mg via ORAL
  Filled 2021-09-02 (×2): qty 1

## 2021-09-02 MED ORDER — PREDNISONE 20 MG PO TABS
40.0000 mg | ORAL_TABLET | Freq: Every day | ORAL | Status: DC
  Administered 2021-09-03: 40 mg via ORAL
  Filled 2021-09-02: qty 2

## 2021-09-02 MED ORDER — AZITHROMYCIN 250 MG PO TABS
500.0000 mg | ORAL_TABLET | Freq: Every day | ORAL | Status: DC
  Administered 2021-09-03: 500 mg via ORAL
  Filled 2021-09-02: qty 2

## 2021-09-02 MED ORDER — BUDESONIDE 0.25 MG/2ML IN SUSP
0.2500 mg | Freq: Two times a day (BID) | RESPIRATORY_TRACT | Status: DC
  Administered 2021-09-02 – 2021-09-03 (×2): 0.25 mg via RESPIRATORY_TRACT
  Filled 2021-09-02 (×2): qty 2

## 2021-09-02 MED ORDER — HYDROCOD POLST-CPM POLST ER 10-8 MG/5ML PO SUER
5.0000 mL | Freq: Two times a day (BID) | ORAL | Status: DC | PRN
  Administered 2021-09-02: 5 mL via ORAL
  Filled 2021-09-02: qty 5

## 2021-09-02 MED ORDER — INSULIN ASPART 100 UNIT/ML IJ SOLN
5.0000 [IU] | Freq: Once | INTRAMUSCULAR | Status: AC
  Administered 2021-09-02: 5 [IU] via SUBCUTANEOUS

## 2021-09-02 MED ORDER — SODIUM CHLORIDE 0.9 % IV SOLN
500.0000 mg | INTRAVENOUS | Status: AC
  Administered 2021-09-02: 500 mg via INTRAVENOUS
  Filled 2021-09-02: qty 5

## 2021-09-02 MED ORDER — GLIPIZIDE 5 MG PO TABS
10.0000 mg | ORAL_TABLET | Freq: Every day | ORAL | Status: DC
  Administered 2021-09-03: 10 mg via ORAL
  Filled 2021-09-02: qty 2

## 2021-09-02 MED ORDER — ASPIRIN EC 81 MG PO TBEC
81.0000 mg | DELAYED_RELEASE_TABLET | Freq: Every day | ORAL | Status: DC
  Administered 2021-09-02 – 2021-09-03 (×2): 81 mg via ORAL
  Filled 2021-09-02 (×2): qty 1

## 2021-09-02 NOTE — Progress Notes (Signed)
PROGRESS NOTE    Mark Curtis  GGY:[id number] DOB: 11-05-1955 DOA: 09/01/2021 PCP: Claiborne Rigg, NP    Brief Narrative:  65 year old male with a long history of tobacco use, diabetes, admitted to the hospital with progressive shortness of breath, wheezing and cough.  Found to have COPD exacerbation/bronchitis.  Started on steroids, bronchodilators and antibiotics.   Assessment & Plan:   Principal Problem:   Dyspnea Active Problems:   Type 2 diabetes mellitus (HCC)   Tobacco use   Dyspnea, likely related to COPD exacerbation/acute bronchitis -Patient does not have a formal diagnosis of COPD, but has a long history of tobacco use -His son does report that he has been having increasing dyspnea on exertion and tends to get fatigued while ambulating -Imaging of his chest did not show any PE or underlying pulmonary edema.  He did have groundglass opacities concerning for underlying infection -He has been treated with Solu-Medrol, will transition to prednisone -Continue on bronchodilators -We will start a course of azithromycin -Continue mucolytic's  Type 2 diabetes -Appears to be chronically on metformin/sitagliptin -Also on glipizide -A1c of 7.1 -Blood sugars elevated currently in the setting of steroids -We will restart glipizide, continue sliding scale insulin  Hypertension -Continue on home dose of lisinopril  Tobacco use -Counseled on importance of tobacco cessation  DVT prophylaxis: enoxaparin (LOVENOX) injection 40 mg Start: 09/02/21 1000  Code Status: Full code Family Communication: Updated patient's son at the bedside Disposition Plan: Status is: Observation  The patient remains OBS appropriate and will d/c before 2 midnights.        Consultants:    Procedures:    Antimicrobials:  Azithromycin 12/11 >   Subjective: Overall feels her breathing is improving.  Still becomes very short of breath when he begins to cough.  Has a little bit of  shortness of breath on exertion.  Objective: Vitals:   09/02/21 0749 09/02/21 1346 09/02/21 1645 09/02/21 1942  BP:  132/84    Pulse:  (!) 104    Resp:  16    Temp:  98.2 F (36.8 C)    TempSrc:  Oral    SpO2: 94% 95% 92% 95%  Weight:      Height:        Intake/Output Summary (Last 24 hours) at 09/02/2021 2049 Last data filed at 09/02/2021 0100 Gross per 24 hour  Intake --  Output 700 ml  Net -700 ml   Filed Weights   09/01/21 1501  Weight: 89 kg    Examination:  General exam: Appears calm and comfortable  Respiratory system: Clear to auscultation. Respiratory effort normal. Cardiovascular system: S1 & S2 heard, RRR. No JVD, murmurs, rubs, gallops or clicks. No pedal edema. Gastrointestinal system: Abdomen is nondistended, soft and nontender. No organomegaly or masses felt. Normal bowel sounds heard. Central nervous system: Alert and oriented. No focal neurological deficits. Extremities: Symmetric 5 x 5 power. Skin: No rashes, lesions or ulcers Psychiatry: Judgement and insight appear normal. Mood & affect appropriate.     Data Reviewed: I have personally reviewed following labs and imaging studies  CBC: Recent Labs  Lab 09/01/21 1810 09/02/21 0639  WBC 10.1 7.4  NEUTROABS 5.7  --   HGB 14.8 14.0  HCT 44.5 41.8  MCV 95.3 95.0  PLT 251 240   Basic Metabolic Panel: Recent Labs  Lab 09/01/21 1810 09/02/21 0639  NA 135 133*  K 4.1 4.6  CL 98 97*  CO2 27 25  GLUCOSE 165* 301*  BUN  10 17  CREATININE 0.89 0.97  CALCIUM 9.5 9.2   GFR: Estimated Creatinine Clearance: 81.9 mL/min (by C-G formula based on SCr of 0.97 mg/dL). Liver Function Tests: Recent Labs  Lab 09/01/21 1810  AST 16  ALT 33  ALKPHOS 105  BILITOT 0.7  PROT 8.6*  ALBUMIN 4.7   No results for input(s): LIPASE, AMYLASE in the last 168 hours. No results for input(s): AMMONIA in the last 168 hours. Coagulation Profile: No results for input(s): INR, PROTIME in the last 168  hours. Cardiac Enzymes: No results for input(s): CKTOTAL, CKMB, CKMBINDEX, TROPONINI in the last 168 hours. BNP (last 3 results) No results for input(s): PROBNP in the last 8760 hours. HbA1C: Recent Labs    09/02/21 0639  HGBA1C 7.1*   CBG: Recent Labs  Lab 09/02/21 0742 09/02/21 1227 09/02/21 1652  GLUCAP 278* 381* 297*   Lipid Profile: No results for input(s): CHOL, HDL, LDLCALC, TRIG, CHOLHDL, LDLDIRECT in the last 72 hours. Thyroid Function Tests: No results for input(s): TSH, T4TOTAL, FREET4, T3FREE, THYROIDAB in the last 72 hours. Anemia Panel: No results for input(s): VITAMINB12, FOLATE, FERRITIN, TIBC, IRON, RETICCTPCT in the last 72 hours. Sepsis Labs: No results for input(s): PROCALCITON, LATICACIDVEN in the last 168 hours.  Recent Results (from the past 240 hour(s))  Resp Panel by RT-PCR (Flu A&B, Covid) Nasopharyngeal Swab     Status: None   Collection Time: 09/01/21  3:07 PM   Specimen: Nasopharyngeal Swab; Nasopharyngeal(NP) swabs in vial transport medium  Result Value Ref Range Status   SARS Coronavirus 2 by RT PCR NEGATIVE NEGATIVE Final    Comment: (NOTE) SARS-CoV-2 target nucleic acids are NOT DETECTED.  The SARS-CoV-2 RNA is generally detectable in upper respiratory specimens during the acute phase of infection. The lowest concentration of SARS-CoV-2 viral copies this assay can detect is 138 copies/mL. A negative result does not preclude SARS-Cov-2 infection and should not be used as the sole basis for treatment or other patient management decisions. A negative result may occur with  improper specimen collection/handling, submission of specimen other than nasopharyngeal swab, presence of viral mutation(s) within the areas targeted by this assay, and inadequate number of viral copies(<138 copies/mL). A negative result must be combined with clinical observations, patient history, and epidemiological information. The expected result is Negative.  Fact  Sheet for Patients:  BloggerCourse.com  Fact Sheet for Healthcare Providers:  SeriousBroker.it  This test is no t yet approved or cleared by the [place] FDA and  has been authorized for detection and/or diagnosis of SARS-CoV-2 by FDA under an Emergency Use Authorization (EUA). This EUA will remain  in effect (meaning this test can be used) for the duration of the COVID-19 declaration under Section 564(b)(1) of the Act, 21 U.S.C.section 360bbb-3(b)(1), unless the authorization is terminated  or revoked sooner.       Influenza A by PCR NEGATIVE NEGATIVE Final   Influenza B by PCR NEGATIVE NEGATIVE Final    Comment: (NOTE) The Xpert Xpress SARS-CoV-2/FLU/RSV plus assay is intended as an aid in the diagnosis of influenza from Nasopharyngeal swab specimens and should not be used as a sole basis for treatment. Nasal washings and aspirates are unacceptable for Xpert Xpress SARS-CoV-2/FLU/RSV testing.  Fact Sheet for Patients: BloggerCourse.com  Fact Sheet for Healthcare Providers: SeriousBroker.it  This test is not yet approved or cleared by the [place] FDA and has been authorized for detection and/or diagnosis of SARS-CoV-2 by FDA under an Emergency Use Authorization (EUA). This EUA  will remain in effect (meaning this test can be used) for the duration of the COVID-19 declaration under Section 564(b)(1) of the Act, 21 U.S.C. section 360bbb-3(b)(1), unless the authorization is terminated or revoked.  Performed at [facility], [address], [place], [place] [address]          Radiology Studies: CT Angio Chest Pulmonary Embolism (PE) W or WO Contrast  Result Date: 09/02/2021 CLINICAL DATA:  Shortness of breath. Pulmonary embolism suspected, high probability. EXAM: CT ANGIOGRAPHY CHEST WITH CONTRAST TECHNIQUE: Multidetector CT imaging of the  chest was performed using the standard protocol during bolus administration of intravenous contrast. Multiplanar CT image reconstructions and MIPs were obtained to evaluate the vascular anatomy. CONTRAST:  66mL OMNIPAQUE IOHEXOL 350 MG/ML SOLN COMPARISON:  08/29/2016. FINDINGS: Cardiovascular: The heart is normal in size and there is a trace pericardial effusion. Mild atherosclerotic calcification of the aorta without evidence of aneurysm. The pulmonary trunk is normal in caliber. No definite evidence of pulmonary embolism. Examination is limited due to mixing artifact and respiratory motion. Mediastinum/Nodes: Shotty lymph nodes are present in the mediastinum and right hilum. No axillary lymphadenopathy. The thyroid gland, trachea, and esophagus are within normal limits. Lungs/Pleura: A few scattered tree-in-bud ground-glass nodular opacities are noted in the upper lobes bilaterally. There is a 6 mm nodule in the right middle lobe, axial image 99, unchanged. There is a 4 mm nodule in the right middle lobe, axial image 96, unchanged. No effusion or pneumothorax. Upper Abdomen: There is diffuse fatty infiltration of the liver. Stones are present within the gallbladder. Musculoskeletal: Degenerative changes are present in the thoracic spine. There is a stable sclerotic lesion at T5, likely bone island. No acute osseous abnormality. Review of the MIP images confirms the above findings. IMPRESSION: 1. No definite evidence of pulmonary embolism. Examination is limited due to mixing artifact and respiratory motion. 2. Tree-in-bud ground-glass nodular opacities in the upper lobes bilaterally, most likely infectious or inflammatory. 3. Stable right middle lobe pulmonary nodules, unchanged from 2017. 4. Hepatic steatosis. 5. Cholelithiasis. Electronically Signed   By: Thornell Sartorius M.D.   On: 09/02/2021 00:15   DG Chest Portable 1 View  Result Date: 09/01/2021 CLINICAL DATA:  Pt c/o SOB since this AM, reports getting  winded easily. Cough, dry cough w/ some phlegm. Some nausea as well. H/o Diabetes. Smoker. cough EXAM: PORTABLE CHEST 1 VIEW COMPARISON:  06/24/2017 FINDINGS: Normal mediastinum and cardiac silhouette. Normal pulmonary vasculature. No evidence of effusion, infiltrate, or pneumothorax. No acute bony abnormality. IMPRESSION: No acute cardiopulmonary process. Electronically Signed   By: Genevive Bi M.D.   On: 09/01/2021 15:37        Scheduled Meds:  [START ON 09/03/2021] azithromycin  500 mg Oral Daily   budesonide (PULMICORT) nebulizer solution  0.25 mg Nebulization BID   enoxaparin (LOVENOX) injection  40 mg Subcutaneous Q24H   guaiFENesin  600 mg Oral BID   insulin aspart  0-9 Units Subcutaneous TID WC   ipratropium-albuterol  3 mL Nebulization Q6H   methylPREDNISolone (SOLU-MEDROL) injection  40 mg Intravenous Q12H   nicotine  21 mg Transdermal Daily   sodium chloride flush  3 mL Intravenous Q12H   Continuous Infusions:   LOS: 0 days    Time spent:    Erick Blinks, MD [facility]   If 7PM-7AM, please contact night-coverage [email or web address]  09/02/2021, 8:49 PM

## 2021-09-03 DIAGNOSIS — E782 Mixed hyperlipidemia: Secondary | ICD-10-CM

## 2021-09-03 LAB — GLUCOSE, CAPILLARY
Glucose-Capillary: 203 mg/dL — ABNORMAL HIGH (ref 70–99)
Glucose-Capillary: 209 mg/dL — ABNORMAL HIGH (ref 70–99)
Glucose-Capillary: 362 mg/dL — ABNORMAL HIGH (ref 70–99)

## 2021-09-03 MED ORDER — ALBUTEROL SULFATE HFA 108 (90 BASE) MCG/ACT IN AERS: 2.0000 | INHALATION_SPRAY | RESPIRATORY_TRACT | 3 refills | Status: DC | PRN

## 2021-09-03 MED ORDER — GLIPIZIDE 10 MG PO TABS: 10.0000 mg | ORAL_TABLET | Freq: Every day | ORAL | 0 refills | Status: DC

## 2021-09-03 MED ORDER — LISINOPRIL 2.5 MG PO TABS: 2.5000 mg | ORAL_TABLET | Freq: Every day | ORAL | 0 refills | Status: DC

## 2021-09-03 MED ORDER — METFORMIN HCL 1000 MG PO TABS: 1000.0000 mg | ORAL_TABLET | Freq: Two times a day (BID) | ORAL | 0 refills | Status: DC

## 2021-09-03 MED ORDER — PREDNISONE 10 MG PO TABS: ORAL_TABLET | ORAL | 0 refills | Status: DC

## 2021-09-03 MED ORDER — HYDROCOD POLST-CPM POLST ER 10-8 MG/5ML PO SUER: 5.0000 mL | Freq: Two times a day (BID) | ORAL | 0 refills | Status: DC | PRN

## 2021-09-03 MED ORDER — ATORVASTATIN CALCIUM 40 MG PO TABS: 40.0000 mg | ORAL_TABLET | Freq: Every day | ORAL | 0 refills | Status: DC

## 2021-09-03 MED ORDER — AZITHROMYCIN 250 MG PO TABS: 250.0000 mg | ORAL_TABLET | Freq: Every day | ORAL | 0 refills | Status: DC

## 2021-09-03 NOTE — Progress Notes (Signed)
SATURATION QUALIFICATIONS: (This note is used to comply with regulatory documentation for home oxygen)  Patient Saturations on Room Air at Rest = 92%  Patient Saturations on Room Air while Ambulating = 89%  Patient Saturations on 1 Liters of oxygen while Ambulating = 91%

## 2021-09-03 NOTE — Discharge Instructions (Signed)

## 2021-09-03 NOTE — TOC Transition Note (Signed)
Transition of Care [facility]) - CM/SW Discharge Note   Patient Details  Name: Mark Curtis MRN: [id number] Date of Birth: May 03, 1956  Transition of Care [facility]) CM/SW Contact:  Bartholome Bill, RN Phone Number: 09/03/2021, 1:04 PM   Clinical Narrative:    Pt provided with Goodrx coupons for dc medications. Per desat screen he does not qualify for home 02.

## 2021-09-03 NOTE — Discharge Summary (Signed)
Physician Discharge Summary  Mark Curtis WUJ:[id number] DOB: 02/27/1956 DOA: 09/01/2021  PCP: Claiborne Rigg, NP  Admit date: 09/01/2021 Discharge date: 09/03/2021  Admitted From: Home Disposition: Home  Recommendations for Outpatient Follow-up:  Would benefit from outpatient referral to pulmonology for PFTs  Home Health: Equipment/Devices:  Discharge Condition: Stable CODE STATUS: Full code Diet recommendation: Heart healthy, carb modified  Brief/Interim Summary: 65 year old male with a long history of tobacco use, diabetes, admitted to the hospital with progressive shortness of breath, wheezing and cough.  Found to have COPD exacerbation/bronchitis.  Started on steroids, bronchodilators and antibiotics.  Discharge Diagnoses:  Principal Problem:   Dyspnea Active Problems:   Type 2 diabetes mellitus (HCC)   Tobacco use  Dyspnea, likely related to COPD exacerbation/acute bronchitis -Patient does not have a formal diagnosis of COPD, but has a long history of tobacco use -His son does report that he has been having increasing dyspnea on exertion and tends to get fatigued while ambulating at baseline -Imaging of his chest did not show any PE or underlying pulmonary edema.  He did have groundglass opacities concerning for underlying infection -He was initially treated with Solu-Medrol, but subsequently transition to prednisone -He was also treated with a course of azithromycin -He received bronchodilators -Overall respiratory status improved, wheezing has resolved and he has been able to ambulate on room air without difficulty.   Type 2 diabetes -Appears to be chronically on metformin and glipizide -A1c of 7.1 -Blood sugars elevated currently in the setting of steroids -Anticipate blood sugar should improve as prednisone tapers off   Hypertension -Continue on home dose of lisinopril   Tobacco use -Counseled on importance of tobacco cessation    Discharge  Instructions  Discharge Instructions     Diet - low sodium heart healthy   Complete by: As directed    Diet - low sodium heart healthy   Complete by: As directed    Increase activity slowly   Complete by: As directed    Increase activity slowly   Complete by: As directed       Allergies as of 09/03/2021   No Known Allergies      Medication List     STOP taking these medications    sitaGLIPtin-metformin 50-1000 MG tablet Commonly known as: Janumet       TAKE these medications    albuterol 108 (90 Base) MCG/ACT inhaler Commonly known as: VENTOLIN HFA Inhale 2 puffs into the lungs every 4 (four) hours as needed for wheezing or shortness of breath.   aspirin EC 81 MG tablet Take 1 tablet (81 mg total) by mouth daily.   atorvastatin 40 MG tablet Commonly known as: LIPITOR Take 1 tablet (40 mg total) by mouth daily at 6 PM.   azithromycin 250 MG tablet Commonly known as: ZITHROMAX Take 1 tablet (250 mg total) by mouth daily.   chlorpheniramine-HYDROcodone 10-8 MG/5ML Suer Commonly known as: TUSSIONEX Take 5 mLs by mouth every 12 (twelve) hours as needed for cough.   gabapentin 100 MG capsule Commonly known as: NEURONTIN Take 100 mg by mouth at bedtime.   glipiZIDE 10 MG tablet Commonly known as: GLUCOTROL Take 1 tablet (10 mg total) by mouth daily before breakfast.   glucose blood test strip Commonly known as: True Metrix Blood Glucose Test Use as instructed   guaiFENesin 100 MG/5ML liquid Commonly known as: ROBITUSSIN Take 5 mLs by mouth every 4 (four) hours as needed for cough or to loosen phlegm.   lisinopril 2.5  MG tablet Commonly known as: ZESTRIL Take 1 tablet (2.5 mg total) by mouth daily.   metFORMIN 1000 MG tablet Commonly known as: GLUCOPHAGE Take 1 tablet (1,000 mg total) by mouth 2 (two) times daily with a meal.   predniSONE 10 MG tablet Commonly known as: DELTASONE Take 40mg  po daily for 2 days then 30mg  daily for 2 days then 20mg   daily for 2 days then 10mg  daily for 2 days then stop   TRUEplus Lancets 28G Misc Use as instructed        No Known Allergies  Consultations:    Procedures/Studies: CT Angio Chest Pulmonary Embolism (PE) W or WO Contrast  Result Date: 09/02/2021 CLINICAL DATA:  Shortness of breath. Pulmonary embolism suspected, high probability. EXAM: CT ANGIOGRAPHY CHEST WITH CONTRAST TECHNIQUE: Multidetector CT imaging of the chest was performed using the standard protocol during bolus administration of intravenous contrast. Multiplanar CT image reconstructions and MIPs were obtained to evaluate the vascular anatomy. CONTRAST:  22mL OMNIPAQUE IOHEXOL 350 MG/ML SOLN COMPARISON:  08/29/2016. FINDINGS: Cardiovascular: The heart is normal in size and there is a trace pericardial effusion. Mild atherosclerotic calcification of the aorta without evidence of aneurysm. The pulmonary trunk is normal in caliber. No definite evidence of pulmonary embolism. Examination is limited due to mixing artifact and respiratory motion. Mediastinum/Nodes: Shotty lymph nodes are present in the mediastinum and right hilum. No axillary lymphadenopathy. The thyroid gland, trachea, and esophagus are within normal limits. Lungs/Pleura: A few scattered tree-in-bud ground-glass nodular opacities are noted in the upper lobes bilaterally. There is a 6 mm nodule in the right middle lobe, axial image 99, unchanged. There is a 4 mm nodule in the right middle lobe, axial image 96, unchanged. No effusion or pneumothorax. Upper Abdomen: There is diffuse fatty infiltration of the liver. Stones are present within the gallbladder. Musculoskeletal: Degenerative changes are present in the thoracic spine. There is a stable sclerotic lesion at T5, likely bone island. No acute osseous abnormality. Review of the MIP images confirms the above findings. IMPRESSION: 1. No definite evidence of pulmonary embolism. Examination is limited due to mixing artifact and  respiratory motion. 2. Tree-in-bud ground-glass nodular opacities in the upper lobes bilaterally, most likely infectious or inflammatory. 3. Stable right middle lobe pulmonary nodules, unchanged from 2017. 4. Hepatic steatosis. 5. Cholelithiasis. Electronically Signed   By: 14/07/2021 M.D.   On: 09/02/2021 00:15   DG Chest Portable 1 View  Result Date: 09/01/2021 CLINICAL DATA:  Pt c/o SOB since this AM, reports getting winded easily. Cough, dry cough w/ some phlegm. Some nausea as well. H/o Diabetes. Smoker. cough EXAM: PORTABLE CHEST 1 VIEW COMPARISON:  06/24/2017 FINDINGS: Normal mediastinum and cardiac silhouette. Normal pulmonary vasculature. No evidence of effusion, infiltrate, or pneumothorax. No acute bony abnormality. IMPRESSION: No acute cardiopulmonary process. Electronically Signed   By: Thornell Sartorius M.D.   On: 09/01/2021 15:37      Subjective: Is feeling better today.  Able to ambulate on room air without shortness of breath.  Discharge Exam: Vitals:   09/03/21 0532 09/03/21 0643 09/03/21 1333 09/03/21 1429  BP: 130/81   116/72  Pulse: 90   (!) 103  Resp: 14   18  Temp: 97.6 F (36.4 C)   (!) 97.3 F (36.3 C)  TempSrc: Oral   Oral  SpO2: 97% 96% 91% 93%  Weight:      Height:        General: Pt is alert, awake, not in acute distress Cardiovascular:  RRR, S1/S2 +, no rubs, no gallops Respiratory: CTA bilaterally, no wheezing, no rhonchi Abdominal: Soft, NT, ND, bowel sounds + Extremities: no edema, no cyanosis    The results of significant diagnostics from this hospitalization (including imaging, microbiology, ancillary and laboratory) are listed below for reference.     Microbiology: Recent Results (from the past 240 hour(s))  Resp Panel by RT-PCR (Flu A&B, Covid) Nasopharyngeal Swab     Status: None   Collection Time: 09/01/21  3:07 PM   Specimen: Nasopharyngeal Swab; Nasopharyngeal(NP) swabs in vial transport medium  Result Value Ref Range Status   SARS  Coronavirus 2 by RT PCR NEGATIVE NEGATIVE Final    Comment: (NOTE) SARS-CoV-2 target nucleic acids are NOT DETECTED.  The SARS-CoV-2 RNA is generally detectable in upper respiratory specimens during the acute phase of infection. The lowest concentration of SARS-CoV-2 viral copies this assay can detect is 138 copies/mL. A negative result does not preclude SARS-Cov-2 infection and should not be used as the sole basis for treatment or other patient management decisions. A negative result may occur with  improper specimen collection/handling, submission of specimen other than nasopharyngeal swab, presence of viral mutation(s) within the areas targeted by this assay, and inadequate number of viral copies(<138 copies/mL). A negative result must be combined with clinical observations, patient history, and epidemiological information. The expected result is Negative.  Fact Sheet for Patients:  BloggerCourse.com  Fact Sheet for Healthcare Providers:  SeriousBroker.it  This test is no t yet approved or cleared by the [place] FDA and  has been authorized for detection and/or diagnosis of SARS-CoV-2 by FDA under an Emergency Use Authorization (EUA). This EUA will remain  in effect (meaning this test can be used) for the duration of the COVID-19 declaration under Section 564(b)(1) of the Act, 21 U.S.C.section 360bbb-3(b)(1), unless the authorization is terminated  or revoked sooner.       Influenza A by PCR NEGATIVE NEGATIVE Final   Influenza B by PCR NEGATIVE NEGATIVE Final    Comment: (NOTE) The Xpert Xpress SARS-CoV-2/FLU/RSV plus assay is intended as an aid in the diagnosis of influenza from Nasopharyngeal swab specimens and should not be used as a sole basis for treatment. Nasal washings and aspirates are unacceptable for Xpert Xpress SARS-CoV-2/FLU/RSV testing.  Fact Sheet for  Patients: BloggerCourse.com  Fact Sheet for Healthcare Providers: SeriousBroker.it  This test is not yet approved or cleared by the [place] FDA and has been authorized for detection and/or diagnosis of SARS-CoV-2 by FDA under an Emergency Use Authorization (EUA). This EUA will remain in effect (meaning this test can be used) for the duration of the COVID-19 declaration under Section 564(b)(1) of the Act, 21 U.S.C. section 360bbb-3(b)(1), unless the authorization is terminated or revoked.  Performed at [facility], [address] [place], [place] [address]      Labs: BNP (last 3 results) No results for input(s): BNP in the last 8760 hours. Basic Metabolic Panel: Recent Labs  Lab 09/01/21 1810 09/02/21 0639  NA 135 133*  K 4.1 4.6  CL 98 97*  CO2 27 25  GLUCOSE 165* 301*  BUN 10 17  CREATININE 0.89 0.97  CALCIUM 9.5 9.2   Liver Function Tests: Recent Labs  Lab 09/01/21 1810  AST 16  ALT 33  ALKPHOS 105  BILITOT 0.7  PROT 8.6*  ALBUMIN 4.7   No results for input(s): LIPASE, AMYLASE in the last 168 hours. No results for input(s): AMMONIA in the last 168  hours. CBC: Recent Labs  Lab 09/01/21 1810 09/02/21 0639  WBC 10.1 7.4  NEUTROABS 5.7  --   HGB 14.8 14.0  HCT 44.5 41.8  MCV 95.3 95.0  PLT 251 240   Cardiac Enzymes: No results for input(s): CKTOTAL, CKMB, CKMBINDEX, TROPONINI in the last 168 hours. BNP: Invalid input(s): POCBNP CBG: Recent Labs  Lab 09/02/21 1652 09/02/21 2227 09/03/21 0015 09/03/21 0728 09/03/21 1138  GLUCAP 297* 412* 362* 209* 203*   D-Dimer No results for input(s): DDIMER in the last 72 hours. Hgb A1c Recent Labs    09/02/21 0639  HGBA1C 7.1*   Lipid Profile No results for input(s): CHOL, HDL, LDLCALC, TRIG, CHOLHDL, LDLDIRECT in the last 72 hours. Thyroid function studies No results for input(s): TSH, T4TOTAL, T3FREE, THYROIDAB in the  last 72 hours.  Invalid input(s): FREET3 Anemia work up No results for input(s): VITAMINB12, FOLATE, FERRITIN, TIBC, IRON, RETICCTPCT in the last 72 hours. Urinalysis    Component Value Date/Time   COLORURINE YELLOW 06/24/2017 1045   APPEARANCEUR CLEAR 06/24/2017 1045   LABSPEC 1.033 (H) 06/24/2017 1045   PHURINE 6.0 06/24/2017 1045   GLUCOSEU >=500 (A) 06/24/2017 1045   HGBUR NEGATIVE 06/24/2017 1045   BILIRUBINUR NEGATIVE 06/24/2017 1045   KETONESUR 5 (A) 06/24/2017 1045   PROTEINUR NEGATIVE 06/24/2017 1045   NITRITE NEGATIVE 06/24/2017 1045   LEUKOCYTESUR NEGATIVE 06/24/2017 1045   Sepsis Labs Invalid input(s): PROCALCITONIN,  WBC,  LACTICIDVEN Microbiology Recent Results (from the past 240 hour(s))  Resp Panel by RT-PCR (Flu A&B, Covid) Nasopharyngeal Swab     Status: None   Collection Time: 09/01/21  3:07 PM   Specimen: Nasopharyngeal Swab; Nasopharyngeal(NP) swabs in vial transport medium  Result Value Ref Range Status   SARS Coronavirus 2 by RT PCR NEGATIVE NEGATIVE Final    Comment: (NOTE) SARS-CoV-2 target nucleic acids are NOT DETECTED.  The SARS-CoV-2 RNA is generally detectable in upper respiratory specimens during the acute phase of infection. The lowest concentration of SARS-CoV-2 viral copies this assay can detect is 138 copies/mL. A negative result does not preclude SARS-Cov-2 infection and should not be used as the sole basis for treatment or other patient management decisions. A negative result may occur with  improper specimen collection/handling, submission of specimen other than nasopharyngeal swab, presence of viral mutation(s) within the areas targeted by this assay, and inadequate number of viral copies(<138 copies/mL). A negative result must be combined with clinical observations, patient history, and epidemiological information. The expected result is Negative.  Fact Sheet for Patients:  BloggerCourse.com  Fact Sheet  for Healthcare Providers:  SeriousBroker.it  This test is no t yet approved or cleared by the [place] FDA and  has been authorized for detection and/or diagnosis of SARS-CoV-2 by FDA under an Emergency Use Authorization (EUA). This EUA will remain  in effect (meaning this test can be used) for the duration of the COVID-19 declaration under Section 564(b)(1) of the Act, 21 U.S.C.section 360bbb-3(b)(1), unless the authorization is terminated  or revoked sooner.       Influenza A by PCR NEGATIVE NEGATIVE Final   Influenza B by PCR NEGATIVE NEGATIVE Final    Comment: (NOTE) The Xpert Xpress SARS-CoV-2/FLU/RSV plus assay is intended as an aid in the diagnosis of influenza from Nasopharyngeal swab specimens and should not be used as a sole basis for treatment. Nasal washings and aspirates are unacceptable for Xpert Xpress SARS-CoV-2/FLU/RSV testing.  Fact Sheet for Patients: BloggerCourse.com  Fact Sheet for Healthcare Providers: SeriousBroker.it  This test is not yet approved or cleared by the [place] and has been authorized for detection and/or diagnosis of SARS-CoV-2 by FDA under an Emergency Use Authorization (EUA). This EUA will remain in effect (meaning this test can be used) for the duration of the COVID-19 declaration under Section 564(b)(1) of the Act, 21 U.S.C. section 360bbb-3(b)(1), unless the authorization is terminated or revoked.  Performed at [facility], [address], [place], [place] [address]      Time coordinating discharge:  SIGNED:   Erick Blinks, MD  [facility] 09/03/2021, 9:50 PM   If 7PM-7AM, please contact night-coverage [email or web address]

## 2021-09-03 NOTE — Progress Notes (Signed)
Nutrition Note  RD consulted for nutrition education regarding diabetes.   Lab Results  Component Value Date   HGBA1C 7.1 (H) 09/02/2021    RD provided "Carbohydrate Counting for People with Diabetes" handout from the Academy of Nutrition and Dietetics. Discussed different food groups and their effects on blood sugar, emphasizing carbohydrate-containing foods. Provided list of carbohydrates and recommended serving sizes of common foods.  Discussed importance of controlled and consistent carbohydrate intake throughout the day. Provided examples of ways to balance meals/snacks and encouraged intake of high-fiber, whole grain complex carbohydrates. Teach back method used.  Expect fair compliance - Grandson at bedside, translated reports family will support him in this transition. Pt typically consumes 3-5 meals daily.   Body mass index is 27.37 kg/m. Pt meets criteria for overweight based on current BMI.  Current diet order is Heart Healthy/ CHO modified, patient is consuming approximately 100% of meals at this time. Labs and medications reviewed. No further nutrition interventions warranted at this time. If additional nutrition issues arise, please re-consult RD.  Tilda Franco, MS, RD, LDN Inpatient Clinical Dietitian Contact information available via Amion

## 2021-09-04 ENCOUNTER — Telehealth: Payer: Self-pay

## 2021-09-04 NOTE — Telephone Encounter (Signed)
Transition Care Management Unsuccessful Follow-up Telephone Call  Call placed with assistance of Spanish Interpreter # [id number]/[employer]  Date of discharge and from where:  09/03/2021, [facility]   Attempts:  1st Attempt  Reason for unsuccessful TCM follow-up call:  Left voice message on # [phone], call back requested.  Call placed to # [phone], no option to leave a voicemail message.  Bertram Denver, NP is listed as PCP but he has not seen her in over 3 years.  Need to discuss scheduling a hospital follow up appointment.

## 2021-09-05 ENCOUNTER — Telehealth: Payer: Self-pay

## 2021-09-05 NOTE — Telephone Encounter (Signed)
Transition Care Management Unsuccessful Follow-up Telephone Call  Call placed with assistance of Spanish Interpreter # [id number]/[employer]  Date of discharge and from where:  09/03/2021, [facility]   Attempts:  2nd Attempt  Reason for unsuccessful TCM follow-up call:  Left voice message on # [phone]. Call back requested to this CM.  Calls placed twice to # [phone].  A person answered each time and then hung up after being asked to confirm his name.   Bertram Denver, NP is listed as PCP but he has not seen her in over 3 years.  Need to discuss scheduling a hospital follow up appointment and inquire if he has a new PCP

## 2021-09-05 NOTE — Telephone Encounter (Signed)
Pt's grandson Mark Curtis returning call.  Pt has been going to the [facility].  He is very excited you called b/c he does not think pt has been getting the care he needs at [facility].  I have scheduled pt a hospital follow up with Zelda on Friday, 09/07/21. Mark Curtis states if you need to speak with him, ok to call.  He works 2nd shift, but he is the one that will bring him to his appointments.  He did say pt is getting very winded when he walks, and sleeps most the time.

## 2021-09-06 NOTE — Telephone Encounter (Signed)
Call returned to patient's Mark Curtis # [phone]. Message left with call back requested to this CM if has any questions, otherwise, his grandfather will be seeing Ms Meredeth Ide, NP tomorrow @ [facility].

## 2021-09-07 ENCOUNTER — Other Ambulatory Visit: Payer: Self-pay

## 2021-09-07 ENCOUNTER — Ambulatory Visit: Payer: Self-pay | Attending: Nurse Practitioner | Admitting: Nurse Practitioner

## 2021-09-07 ENCOUNTER — Encounter: Payer: Self-pay | Admitting: Nurse Practitioner

## 2021-09-07 VITALS — BP 114/80 | HR 105 | Ht 71.0 in | Wt 190.4 lb

## 2021-09-07 DIAGNOSIS — Z23 Encounter for immunization: Secondary | ICD-10-CM

## 2021-09-07 DIAGNOSIS — J441 Chronic obstructive pulmonary disease with (acute) exacerbation: Secondary | ICD-10-CM

## 2021-09-07 DIAGNOSIS — E782 Mixed hyperlipidemia: Secondary | ICD-10-CM

## 2021-09-07 DIAGNOSIS — Z7689 Persons encountering health services in other specified circumstances: Secondary | ICD-10-CM

## 2021-09-07 DIAGNOSIS — E1165 Type 2 diabetes mellitus with hyperglycemia: Secondary | ICD-10-CM

## 2021-09-07 DIAGNOSIS — Z1211 Encounter for screening for malignant neoplasm of colon: Secondary | ICD-10-CM

## 2021-09-07 MED ORDER — ATORVASTATIN CALCIUM 40 MG PO TABS
40.0000 mg | ORAL_TABLET | Freq: Every day | ORAL | 1 refills | Status: DC
  Filled 2021-09-07: qty 30, 30d supply, fill #0

## 2021-09-07 MED ORDER — ALBUTEROL SULFATE HFA 108 (90 BASE) MCG/ACT IN AERS
2.0000 | INHALATION_SPRAY | RESPIRATORY_TRACT | 3 refills | Status: DC | PRN
  Filled 2021-09-07: qty 18, 16d supply, fill #0

## 2021-09-07 MED ORDER — PREDNISONE 10 MG PO TABS
50.0000 mg | ORAL_TABLET | Freq: Every day | ORAL | 0 refills | Status: AC
  Filled 2021-09-07: qty 25, 5d supply, fill #0

## 2021-09-07 MED ORDER — FLUTICASONE FUROATE-VILANTEROL 200-25 MCG/ACT IN AEPB
1.0000 | INHALATION_SPRAY | Freq: Every day | RESPIRATORY_TRACT | 11 refills | Status: DC
  Filled 2021-09-07: qty 60, 30d supply, fill #0

## 2021-09-07 MED ORDER — METFORMIN HCL 1000 MG PO TABS
1000.0000 mg | ORAL_TABLET | Freq: Two times a day (BID) | ORAL | 6 refills | Status: DC
  Filled 2021-09-07: qty 60, 30d supply, fill #0

## 2021-09-07 MED ORDER — IPRATROPIUM-ALBUTEROL 0.5-2.5 (3) MG/3ML IN SOLN
3.0000 mL | Freq: Four times a day (QID) | RESPIRATORY_TRACT | 6 refills | Status: DC | PRN
  Filled 2021-09-07: qty 360, 30d supply, fill #0

## 2021-09-07 MED ORDER — GLIPIZIDE 10 MG PO TABS
10.0000 mg | ORAL_TABLET | Freq: Every day | ORAL | 0 refills | Status: DC
  Filled 2021-09-07: qty 30, 30d supply, fill #0

## 2021-09-07 NOTE — Progress Notes (Signed)
Assessment & Plan:  Deionte was seen today for hypertension.  Diagnoses and all orders for this visit:  Encounter to establish care  Chronic obstructive pulmonary disease with acute exacerbation (HCC) -     predniSONE (DELTASONE) 10 MG tablet; Take 5 tablets (50 mg total) by mouth daily with breakfast for 5 days. -     ipratropium-albuterol (DUONEB) 0.5-2.5 (3) MG/3ML SOLN; Take 3 mLs by nebulization every 6 (six) hours as needed. -     fluticasone furoate-vilanterol (BREO ELLIPTA) 200-25 MCG/ACT AEPB; Inhale 1 puff into the lungs daily. NEEDS PASS -     albuterol (VENTOLIN HFA) 108 (90 Base) MCG/ACT inhaler; Inhale 2 puffs into the lungs every 4 (four) hours as needed for wheezing or shortness of breath. NEEDS PASS Encouraged smoking cessation  Colon cancer screening -     Fecal occult blood, imunochemical  Type 2 diabetes mellitus with hyperglycemia, without long-term current use of insulin (HCC) -     metFORMIN (GLUCOPHAGE) 1000 MG tablet; Take 1 tablet (1,000 mg total) by mouth 2 (two) times daily with a meal. NEEDS PASS -     glipiZIDE (GLUCOTROL) 10 MG tablet; Take 1 tablet (10 mg total) by mouth daily before breakfast. NEEDS PASS  Mixed hyperlipidemia -     atorvastatin (LIPITOR) 40 MG tablet; Take 1 tablet (40 mg total) by mouth daily at 6 PM. NEEDS PASS    Patient has been counseled on age-appropriate routine health concerns for screening and prevention. These are reviewed and up-to-date. Referrals have been placed accordingly. Immunizations are up-to-date or declined.    Subjective:   Chief Complaint  Patient presents with   Hypertension   HPI Mark Curtis 65 y.o. male presents to office today accompanied by his son who is translating for him. He is being evaluated for HFU and to establish care.   has a past medical history of Chronic headaches, COPD (chronic obstructive pulmonary disease) (HCC), DM (diabetes mellitus) (HCC), and Spell of dizziness.   COPD  exacerbation Admitted to the hospital for 2 days with COPD exacerbation/acute bronchitis.  He is a smoker of 44 years (1ppd) with recent cessation of tobacco use upon discharge from the hospital on September 05, 2021. No previous history of diagnosed COPD. During his hospital admission he was treated for probable underlying infection with zpak upon discharge and while IP for COPD treated with bronchodilators and solumedrol which was transitioned to prednisone upon discharge (tapered for 10 days). CT neg for PE.  "Tree-in-bud groundglass nodular opacity seen in the upper lobes. Stable right middle lobe pulmonary nodules seen on CT, unchanged from 2017"   Today he endorses shortness of breath with minimal activity and minimal exertion. Assuming a tripod position frequently here in office today. He was only discharged with albuterol inhaler. It was recommened he follow up with Pulmonlogy for possible PFT. He is uninsured. Patient was urged to apply for the financial assistance program.  They were instructed to inquire at the front desk about the application process for the [place] discount, orange card or other financial assistance.  He was given a nebulizer machine as well as started on Breo today.  I would like for him to follow up with our [facility] Pulmonologist Dr. Delford Field for evaluation and further recommendations in a few weeks.   DM 2 Diabetes is controlled but not optimal. He will continue on metformin and glipizide at this time.  Lab Results  Component Value Date   HGBA1C 7.1 (H) 09/02/2021  Review of Systems  Constitutional:  Negative for fever, malaise/fatigue and weight loss.  HENT: Negative.  Negative for nosebleeds.   Eyes: Negative.  Negative for blurred vision, double vision and photophobia.  Respiratory:  Positive for cough, sputum production, shortness of breath and wheezing.   Cardiovascular: Negative.  Negative for chest pain, palpitations and leg swelling.  Gastrointestinal:  Negative.  Negative for heartburn, nausea and vomiting.  Musculoskeletal: Negative.  Negative for myalgias.  Neurological: Negative.  Negative for dizziness, focal weakness, seizures and headaches.  Psychiatric/Behavioral: Negative.  Negative for suicidal ideas.    Past Medical History:  Diagnosis Date   Chronic headaches    COPD (chronic obstructive pulmonary disease) (HCC)    DM (diabetes mellitus) (HCC)    TYPE 2   Spell of dizziness    WHEN STANDING    History reviewed. No pertinent surgical history.  Family History  Problem Relation Age of Onset   Diabetes Father     Social History Reviewed with no changes to be made today.   Outpatient Medications Prior to Visit  Medication Sig Dispense Refill   aspirin EC 81 MG tablet Take 1 tablet (81 mg total) by mouth daily. 30 tablet 5   azithromycin (ZITHROMAX) 250 MG tablet Take 1 tablet (250 mg total) by mouth daily. 4 each 0   chlorpheniramine-HYDROcodone (TUSSIONEX) 10-8 MG/5ML SUER Take 5 mLs by mouth every 12 (twelve) hours as needed for cough. 140 mL 0   glucose blood (TRUE METRIX BLOOD GLUCOSE TEST) test strip Use as instructed 100 each 12   guaiFENesin (ROBITUSSIN) 100 MG/5ML liquid Take 5 mLs by mouth every 4 (four) hours as needed for cough or to loosen phlegm.     lisinopril (ZESTRIL) 2.5 MG tablet Take 1 tablet (2.5 mg total) by mouth daily. 30 tablet 0   TRUEPLUS LANCETS 28G MISC Use as instructed 100 each 3   albuterol (VENTOLIN HFA) 108 (90 Base) MCG/ACT inhaler Inhale 2 puffs into the lungs every 4 (four) hours as needed for wheezing or shortness of breath. 1 each 3   atorvastatin (LIPITOR) 40 MG tablet Take 1 tablet (40 mg total) by mouth daily at 6 PM. 30 tablet 0   gabapentin (NEURONTIN) 100 MG capsule Take 100 mg by mouth at bedtime.     glipiZIDE (GLUCOTROL) 10 MG tablet Take 1 tablet (10 mg total) by mouth daily before breakfast. 30 tablet 0   metFORMIN (GLUCOPHAGE) 1000 MG tablet Take 1 tablet (1,000 mg total)  by mouth 2 (two) times daily with a meal. 60 tablet 0   predniSONE (DELTASONE) 10 MG tablet Take 40mg  po daily for 2 days then 30mg  daily for 2 days then 20mg  daily for 2 days then 10mg  daily for 2 days then stop 20 tablet 0   No facility-administered medications prior to visit.    No Known Allergies     Objective:    BP 114/80    Pulse (!) 105    Ht 5\' 11"  (1.803 m)    Wt 190 lb 6 oz (86.4 kg)    SpO2 93%    BMI 26.55 kg/m  Wt Readings from Last 3 Encounters:  09/07/21 190 lb 6 oz (86.4 kg)  09/01/21 196 lb 3.4 oz (89 kg)  06/19/18 195 lb 12.8 oz (88.8 kg)    Physical Exam Vitals and nursing note reviewed.  Constitutional:      Appearance: He is well-developed.  HENT:     Head: Normocephalic and atraumatic.  Cardiovascular:  Rate and Rhythm: Regular rhythm. Tachycardia present.     Heart sounds: Normal heart sounds. No murmur heard.   No friction rub. No gallop.  Pulmonary:     Effort: Accessory muscle usage present. No tachypnea or respiratory distress.     Breath sounds: Examination of the right-upper field reveals decreased breath sounds and wheezing. Examination of the left-upper field reveals decreased breath sounds and wheezing. Examination of the right-middle field reveals wheezing. Examination of the left-middle field reveals wheezing. Examination of the right-lower field reveals wheezing. Examination of the left-lower field reveals wheezing. Decreased breath sounds and wheezing present. No rhonchi or rales.  Chest:     Chest wall: No tenderness.  Abdominal:     General: Bowel sounds are normal.     Palpations: Abdomen is soft.  Musculoskeletal:        General: Normal range of motion.     Cervical back: Normal range of motion.  Skin:    General: Skin is warm and dry.  Neurological:     Mental Status: He is alert and oriented to person, place, and time.     Coordination: Coordination normal.  Psychiatric:        Behavior: Behavior normal. Behavior is  cooperative.        Thought Content: Thought content normal.        Judgment: Judgment normal.         Patient has been counseled extensively about nutrition and exercise as well as the importance of adherence with medications and regular follow-up. The patient was given clear instructions to go to ER or return to medical center if symptoms don't improve, worsen or new problems develop. The patient verbalized understanding.   Follow-up: Return for SEE DR WRIGHT COPD. NURSE VISIT FOR WALK TEST not on a tuesday. SEE ME mid or late march.   Claiborne Rigg, FNP-BC [facility] [place], [place] [phone]   09/07/2021, 8:40 PM

## 2021-09-10 ENCOUNTER — Other Ambulatory Visit: Payer: Self-pay

## 2021-09-12 ENCOUNTER — Ambulatory Visit: Payer: Self-pay

## 2021-09-14 ENCOUNTER — Other Ambulatory Visit: Payer: Self-pay

## 2021-09-19 ENCOUNTER — Other Ambulatory Visit: Payer: Self-pay

## 2021-10-02 ENCOUNTER — Other Ambulatory Visit: Payer: Self-pay

## 2021-11-04 NOTE — Progress Notes (Deleted)
Established Patient Office Visit  Subjective:  Patient ID: Mark Curtis, male    DOB: August 01, 1956  Age: 66 y.o. MRN: [id number]  CC: No chief complaint on file.   HPI Mark Curtis presents for Copd evaluation.  Pcv 20 flu   Admit date: 09/01/2021 Discharge date: 09/03/2021   Admitted From: Home Disposition: Home   Recommendations for Outpatient Follow-up:  Would benefit from outpatient referral to pulmonology for PFTs   Home Health: Equipment/Devices:   Discharge Condition: Stable CODE STATUS: Full code Diet recommendation: Heart healthy, carb modified   Brief/Interim Summary: 66 year old male with a long history of tobacco use, diabetes, admitted to the hospital with progressive shortness of breath, wheezing and cough.  Found to have COPD exacerbation/bronchitis.  Started on steroids, bronchodilators and antibiotics.   Discharge Diagnoses:  Principal Problem:   Dyspnea Active Problems:   Type 2 diabetes mellitus (HCC)   Tobacco use   Dyspnea, likely related to COPD exacerbation/acute bronchitis -Patient does not have a formal diagnosis of COPD, but has a long history of tobacco use -His son does report that he has been having increasing dyspnea on exertion and tends to get fatigued while ambulating at baseline -Imaging of his chest did not show any PE or underlying pulmonary edema.  He did have groundglass opacities concerning for underlying infection -He was initially treated with Solu-Medrol, but subsequently transition to prednisone -He was also treated with a course of azithromycin -He received bronchodilators -Overall respiratory status improved, wheezing has resolved and he has been able to ambulate on room air without difficulty.   Type 2 diabetes -Appears to be chronically on metformin and glipizide -A1c of 7.1 -Blood sugars elevated currently in the setting of steroids -Anticipate blood sugar should improve as prednisone tapers off    Hypertension -Continue on home dose of lisinopril   Tobacco use -Counseled on importance of tobacco cessation  Past Medical History:  Diagnosis Date   Chronic headaches    COPD (chronic obstructive pulmonary disease) (HCC)    DM (diabetes mellitus) (HCC)    TYPE 2   Spell of dizziness    WHEN STANDING    No past surgical history on file.  Family History  Problem Relation Age of Onset   Diabetes Father     Social History   Socioeconomic History   Marital status: Married    Spouse name: Not on file   Number of children: Not on file   Years of education: Not on file   Highest education level: Not on file  Occupational History   Not on file  Tobacco Use   Smoking status: Former    Packs/day: 0.25    Years: 30.00    Pack years: 7.50    Types: Cigarettes    Quit date: 08/31/2021    Years since quitting: 0.1   Smokeless tobacco: Never  Vaping Use   Vaping Use: Never used  Substance and Sexual Activity   Alcohol use: No   Drug use: No   Sexual activity: Not Currently  Other Topics Concern   Not on file  Social History Narrative   Not on file   Social Determinants of Health   Financial Resource Strain: Not on file  Food Insecurity: Not on file  Transportation Needs: Not on file  Physical Activity: Not on file  Stress: Not on file  Social Connections: Not on file  Intimate Partner Violence: Not on file    Outpatient Medications Prior to Visit  Medication Sig Dispense  Refill   albuterol (VENTOLIN HFA) 108 (90 Base) MCG/ACT inhaler Inhale 2 puffs into the lungs every 4 (four) hours as needed for wheezing or shortness of breath. NEEDS PASS 18 g 3   aspirin EC 81 MG tablet Take 1 tablet (81 mg total) by mouth daily. 30 tablet 5   atorvastatin (LIPITOR) 40 MG tablet Take 1 tablet (40 mg total) by mouth daily at 6 PM. NEEDS PASS 90 tablet 1   azithromycin (ZITHROMAX) 250 MG tablet Take 1 tablet (250 mg total) by mouth daily. 4 each 0    chlorpheniramine-HYDROcodone (TUSSIONEX) 10-8 MG/5ML SUER Take 5 mLs by mouth every 12 (twelve) hours as needed for cough. 140 mL 0   fluticasone furoate-vilanterol (BREO ELLIPTA) 200-25 MCG/ACT AEPB Inhale 1 puff into the lungs daily. NEEDS PASS 60 each 11   glipiZIDE (GLUCOTROL) 10 MG tablet Take 1 tablet (10 mg total) by mouth daily before breakfast. NEEDS PASS 90 tablet 0   glucose blood (TRUE METRIX BLOOD GLUCOSE TEST) test strip Use as instructed 100 each 12   guaiFENesin (ROBITUSSIN) 100 MG/5ML liquid Take 5 mLs by mouth every 4 (four) hours as needed for cough or to loosen phlegm.     ipratropium-albuterol (DUONEB) 0.5-2.5 (3) MG/3ML SOLN Take 3 mLs by nebulization every 6 (six) hours as needed. 360 mL 6   lisinopril (ZESTRIL) 2.5 MG tablet Take 1 tablet (2.5 mg total) by mouth daily. 30 tablet 0   metFORMIN (GLUCOPHAGE) 1000 MG tablet Take 1 tablet (1,000 mg total) by mouth 2 (two) times daily with a meal. NEEDS PASS 60 tablet 6   TRUEPLUS LANCETS 28G MISC Use as instructed 100 each 3   No facility-administered medications prior to visit.    No Known Allergies  ROS Review of Systems    Objective:    Physical Exam  There were no vitals taken for this visit. Wt Readings from Last 3 Encounters:  09/07/21 190 lb 6 oz (86.4 kg)  09/01/21 196 lb 3.4 oz (89 kg)  06/19/18 195 lb 12.8 oz (88.8 kg)     Health Maintenance Due  Topic Date Due   COVID-19 Vaccine (1) Never done   OPHTHALMOLOGY EXAM  Never done   TETANUS/TDAP  Never done   COLON CANCER SCREENING ANNUAL FOBT  Never done   Zoster Vaccines- Shingrix (1 of 2) Never done   FOOT EXAM  08/13/2018   INFLUENZA VACCINE  Never done   Pneumonia Vaccine 50+ Years old (1 - PCV) 11/02/2021    There are no preventive care reminders to display for this patient.  No results found for: TSH Lab Results  Component Value Date   WBC 7.4 09/02/2021   HGB 14.0 09/02/2021   HCT 41.8 09/02/2021   MCV 95.0 09/02/2021   PLT 240  09/02/2021   Lab Results  Component Value Date   NA 133 (L) 09/02/2021   K 4.6 09/02/2021   CO2 25 09/02/2021   GLUCOSE 301 (H) 09/02/2021   BUN 17 09/02/2021   CREATININE 0.97 09/02/2021   BILITOT 0.7 09/01/2021   ALKPHOS 105 09/01/2021   AST 16 09/01/2021   ALT 33 09/01/2021   PROT 8.6 (H) 09/01/2021   ALBUMIN 4.7 09/01/2021   CALCIUM 9.2 09/02/2021   ANIONGAP 11 09/02/2021   Lab Results  Component Value Date   CHOL 143 06/19/2018   Lab Results  Component Value Date   HDL 29 (L) 06/19/2018   Lab Results  Component Value Date   LDLCALC  63 06/19/2018   Lab Results  Component Value Date   TRIG 255 (H) 06/19/2018   Lab Results  Component Value Date   CHOLHDL 4.9 06/19/2018   Lab Results  Component Value Date   HGBA1C 7.1 (H) 09/02/2021   CT chest 08/2021 IMPRESSION: 1. No definite evidence of pulmonary embolism. Examination is limited due to mixing artifact and respiratory motion. 2. Tree-in-bud ground-glass nodular opacities in the upper lobes bilaterally, most likely infectious or inflammatory. 3. Stable right middle lobe pulmonary nodules, unchanged from 2017. 4. Hepatic steatosis. 5. Cholelithiasis.   Assessment & Plan:   Problem List Items Addressed This Visit   None   No orders of the defined types were placed in this encounter.   Follow-up: No follow-ups on file.    Shan Levans, MD

## 2021-11-05 ENCOUNTER — Ambulatory Visit: Payer: Self-pay | Admitting: Critical Care Medicine

## 2021-12-17 ENCOUNTER — Telehealth: Payer: Self-pay | Admitting: Nurse Practitioner

## 2021-12-17 NOTE — Telephone Encounter (Signed)
NO answer and Unable to LVM on [phone]  ? ?LVM with help of interpreter on [phone]  ?

## 2021-12-26 ENCOUNTER — Other Ambulatory Visit: Payer: Self-pay

## 2022-01-01 ENCOUNTER — Other Ambulatory Visit: Payer: Self-pay

## 2022-03-18 ENCOUNTER — Other Ambulatory Visit: Payer: Self-pay

## 2022-03-25 ENCOUNTER — Other Ambulatory Visit: Payer: Self-pay

## 2022-03-28 ENCOUNTER — Other Ambulatory Visit: Payer: Self-pay

## 2022-04-17 ENCOUNTER — Ambulatory Visit: Payer: Self-pay | Attending: Nurse Practitioner | Admitting: Nurse Practitioner

## 2022-04-17 ENCOUNTER — Telehealth: Payer: Self-pay | Admitting: Emergency Medicine

## 2022-04-17 ENCOUNTER — Encounter: Payer: Self-pay | Admitting: Nurse Practitioner

## 2022-04-17 VITALS — BP 119/79 | HR 79 | Temp 98.4°F | Resp 16 | Wt 184.6 lb

## 2022-04-17 DIAGNOSIS — J441 Chronic obstructive pulmonary disease with (acute) exacerbation: Secondary | ICD-10-CM

## 2022-04-17 DIAGNOSIS — E1165 Type 2 diabetes mellitus with hyperglycemia: Secondary | ICD-10-CM

## 2022-04-17 DIAGNOSIS — M25561 Pain in right knee: Secondary | ICD-10-CM

## 2022-04-17 DIAGNOSIS — Z1211 Encounter for screening for malignant neoplasm of colon: Secondary | ICD-10-CM

## 2022-04-17 DIAGNOSIS — M25562 Pain in left knee: Secondary | ICD-10-CM

## 2022-04-17 DIAGNOSIS — G8929 Other chronic pain: Secondary | ICD-10-CM

## 2022-04-17 DIAGNOSIS — Z130 Encounter for screening for diseases of the blood and blood-forming organs and certain disorders involving the immune mechanism: Secondary | ICD-10-CM

## 2022-04-17 DIAGNOSIS — E782 Mixed hyperlipidemia: Secondary | ICD-10-CM

## 2022-04-17 LAB — GLUCOSE, POCT (MANUAL RESULT ENTRY): POC Glucose: 229 mg/dl — AB (ref 70–99)

## 2022-04-17 LAB — POCT GLYCOSYLATED HEMOGLOBIN (HGB A1C): HbA1c, POC (controlled diabetic range): 8.6 % — AB (ref 0.0–7.0)

## 2022-04-17 MED ORDER — TRUEPLUS LANCETS 28G MISC: 3 refills | Status: DC

## 2022-04-17 MED ORDER — ALBUTEROL SULFATE HFA 108 (90 BASE) MCG/ACT IN AERS: 2.0000 | INHALATION_SPRAY | RESPIRATORY_TRACT | 3 refills | Status: DC | PRN

## 2022-04-17 MED ORDER — FLUTICASONE FUROATE-VILANTEROL 200-25 MCG/ACT IN AEPB: 1.0000 | INHALATION_SPRAY | Freq: Every day | RESPIRATORY_TRACT | 11 refills | Status: DC

## 2022-04-17 MED ORDER — GLIPIZIDE 10 MG PO TABS: 10.0000 mg | ORAL_TABLET | Freq: Every day | ORAL | 1 refills | Status: DC

## 2022-04-17 MED ORDER — TRUE METRIX BLOOD GLUCOSE TEST VI STRP: ORAL_STRIP | 12 refills | Status: DC

## 2022-04-17 MED ORDER — MELOXICAM 7.5 MG PO TABS: 7.5000 mg | ORAL_TABLET | Freq: Every day | ORAL | 1 refills | Status: DC

## 2022-04-17 MED ORDER — IPRATROPIUM-ALBUTEROL 0.5-2.5 (3) MG/3ML IN SOLN: 3.0000 mL | Freq: Four times a day (QID) | RESPIRATORY_TRACT | 6 refills | Status: AC | PRN

## 2022-04-17 MED ORDER — METFORMIN HCL 1000 MG PO TABS: 1000.0000 mg | ORAL_TABLET | Freq: Two times a day (BID) | ORAL | 6 refills | Status: DC

## 2022-04-17 MED ORDER — LISINOPRIL 2.5 MG PO TABS: 2.5000 mg | ORAL_TABLET | Freq: Every day | ORAL | 1 refills | Status: DC

## 2022-04-17 MED ORDER — ATORVASTATIN CALCIUM 40 MG PO TABS: 40.0000 mg | ORAL_TABLET | Freq: Every day | ORAL | 1 refills | Status: DC

## 2022-04-17 MED ORDER — TRUE METRIX BLOOD GLUCOSE TEST VI STRP: ORAL_STRIP | 3 refills | Status: DC

## 2022-04-17 NOTE — Progress Notes (Signed)
Assessment & Plan:  Ozil was seen today for knee pain, medication refill and diabetes.  Diagnoses and all orders for this visit:  Type 2 diabetes mellitus with hyperglycemia, without long-term current use of insulin (HCC) -     HgB A1c -     Glucose (CBG) -     glipiZIDE (GLUCOTROL) 10 MG tablet; Take 1 tablet (10 mg total) by mouth daily before breakfast. NEEDS PASS -     metFORMIN (GLUCOPHAGE) 1000 MG tablet; Take 1 tablet (1,000 mg total) by mouth 2 (two) times daily with a meal. NEEDS PASS -     lisinopril (ZESTRIL) 2.5 MG tablet; Take 1 tablet (2.5 mg total) by mouth daily. -     TRUEplus Lancets 28G MISC; Use as instructed -     glucose blood (TRUE METRIX BLOOD GLUCOSE TEST) test strip; Use as instructed -     Ambulatory referral to Ophthalmology -     Microalbumin / creatinine urine ratio -     CMP14+EGFR  Mixed hyperlipidemia -     atorvastatin (LIPITOR) 40 MG tablet; Take 1 tablet (40 mg total) by mouth daily at 6 PM. NEEDS PASS -     Lipid panel  Chronic obstructive pulmonary disease with acute exacerbation (HCC) -     fluticasone furoate-vilanterol (BREO ELLIPTA) 200-25 MCG/ACT AEPB; Inhale 1 puff into the lungs daily. NEEDS PASS -     ipratropium-albuterol (DUONEB) 0.5-2.5 (3) MG/3ML SOLN; Take 3 mLs by nebulization every 6 (six) hours as needed. -     albuterol (VENTOLIN HFA) 108 (90 Base) MCG/ACT inhaler; Inhale 2 puffs into the lungs every 4 (four) hours as needed for wheezing or shortness of breath. NEEDS PASS  Colon cancer screening -     Cologuard  Chronic pain of both knees -     meloxicam (MOBIC) 7.5 MG tablet; Take 1 tablet (7.5 mg total) by mouth daily. FOR KNEE PAIN -     DG Knee Complete 4 Views Right; Future -     DG Knee Complete 4 Views Left; Future  Screening for deficiency anemia -     CBC with Differential    Patient has been counseled on age-appropriate routine health concerns for screening and prevention. These are reviewed and up-to-date.  Referrals have been placed accordingly. Immunizations are up-to-date or declined.    Subjective:   Chief Complaint  Patient presents with   Knee Pain    Both kneel pain   Medication Refill   Diabetes   HPI Mark Curtis 66 y.o. male presents to office today for knee pain and follow up to DM  He has a past medical history of Chronic headaches, COPD (quit smoking and now dipping tobacco), DM 2, HPL, and dizziness.   Son is translating for him today as he is non english speaking.    HTN Blood pressure is well controlled. He is taking renal dose ACE.  BP Readings from Last 3 Encounters:  04/17/22 119/79  09/07/21 114/80  09/03/21 116/72     DM 2 Poorly controlled. Will resume medications and have him return with meter for meter check. May need to add additional medication based on readings. He has been out of his medications for over 2 weeks (metformin and glipizide). Has not been monitoring blood glucose levels as he ran out of strips. LDL at goal with atorvastatin 40 mg daily. Lab Results  Component Value Date   HGBA1C 8.6 (A) 04/17/2022    Lab Results  Component  Value Date   LDLCALC 63 06/19/2018     B/L Knee Pain Onset of the symptoms was several months ago. Inciting event: none known. Current symptoms include pain located in the patellar region, stiffness, and swelling. Pain is aggravated by any weight bearing, going up and down stairs, and and worse upon awakening .  Patient has had no prior knee problems. Evaluation to date: none. Treatment to date: none.      Review of Systems  Constitutional:  Negative for fever, malaise/fatigue and weight loss.  HENT: Negative.  Negative for nosebleeds.   Eyes: Negative.  Negative for blurred vision, double vision and photophobia.  Respiratory: Negative.  Negative for cough and shortness of breath.   Cardiovascular: Negative.  Negative for chest pain, palpitations and leg swelling.  Gastrointestinal: Negative.  Negative for  heartburn, nausea and vomiting.  Musculoskeletal:  Positive for joint pain. Negative for myalgias.  Neurological: Negative.  Negative for dizziness, focal weakness, seizures and headaches.  Psychiatric/Behavioral: Negative.  Negative for suicidal ideas.     Past Medical History:  Diagnosis Date   Chronic headaches    COPD (chronic obstructive pulmonary disease) (HCC)    DM (diabetes mellitus) ([place])    TYPE 2   Spell of dizziness    WHEN STANDING    No past surgical history on file.  Family History  Problem Relation Age of Onset   Diabetes Father     Social History Reviewed with no changes to be made today.   Outpatient Medications Prior to Visit  Medication Sig Dispense Refill   albuterol (VENTOLIN HFA) 108 (90 Base) MCG/ACT inhaler Inhale 2 puffs into the lungs every 4 (four) hours as needed for wheezing or shortness of breath. NEEDS PASS 18 g 3   metFORMIN (GLUCOPHAGE) 1000 MG tablet Take 1 tablet (1,000 mg total) by mouth 2 (two) times daily with a meal. NEEDS PASS 60 tablet 6   aspirin EC 81 MG tablet Take 1 tablet (81 mg total) by mouth daily. (Patient not taking: Reported on 04/17/2022) 30 tablet 5   atorvastatin (LIPITOR) 40 MG tablet Take 1 tablet (40 mg total) by mouth daily at 6 PM. NEEDS PASS 90 tablet 1   azithromycin (ZITHROMAX) 250 MG tablet Take 1 tablet (250 mg total) by mouth daily. (Patient not taking: Reported on 04/17/2022) 4 each 0   chlorpheniramine-HYDROcodone (TUSSIONEX) 10-8 MG/5ML SUER Take 5 mLs by mouth every 12 (twelve) hours as needed for cough. (Patient not taking: Reported on 04/17/2022) 140 mL 0   fluticasone furoate-vilanterol (BREO ELLIPTA) 200-25 MCG/ACT AEPB Inhale 1 puff into the lungs daily. NEEDS PASS (Patient not taking: Reported on 04/17/2022) 60 each 11   glipiZIDE (GLUCOTROL) 10 MG tablet Take 1 tablet (10 mg total) by mouth daily before breakfast. NEEDS PASS 90 tablet 0   glucose blood (TRUE METRIX BLOOD GLUCOSE TEST) test strip Use as  instructed (Patient not taking: Reported on 04/17/2022) 100 each 12   guaiFENesin (ROBITUSSIN) 100 MG/5ML liquid Take 5 mLs by mouth every 4 (four) hours as needed for cough or to loosen phlegm. (Patient not taking: Reported on 04/17/2022)     ipratropium-albuterol (DUONEB) 0.5-2.5 (3) MG/3ML SOLN Take 3 mLs by nebulization every 6 (six) hours as needed. (Patient not taking: Reported on 04/17/2022) 360 mL 6   lisinopril (ZESTRIL) 2.5 MG tablet Take 1 tablet (2.5 mg total) by mouth daily. 30 tablet 0   TRUEPLUS LANCETS 28G MISC Use as instructed (Patient not taking: Reported on 04/17/2022) 100  each 3   No facility-administered medications prior to visit.    No Known Allergies     Objective:    BP 119/79 (BP Location: Left Arm, Patient Position: Sitting, Cuff Size: Normal)   Pulse 79   Temp 98.4 F (36.9 C) (Oral)   Resp 16   Wt 184 lb 9.6 oz (83.7 kg)   SpO2 96%   BMI 25.75 kg/m  Wt Readings from Last 3 Encounters:  04/17/22 184 lb 9.6 oz (83.7 kg)  09/07/21 190 lb 6 oz (86.4 kg)  09/01/21 196 lb 3.4 oz (89 kg)    Physical Exam Vitals and nursing note reviewed.  Constitutional:      Appearance: He is well-developed.  HENT:     Head: Normocephalic and atraumatic.  Cardiovascular:     Rate and Rhythm: Normal rate and regular rhythm.     Heart sounds: Normal heart sounds. No murmur heard.    No friction rub. No gallop.  Pulmonary:     Effort: Pulmonary effort is normal. No tachypnea or respiratory distress.     Breath sounds: Normal breath sounds. No decreased breath sounds, wheezing, rhonchi or rales.  Chest:     Chest wall: No tenderness.  Abdominal:     General: Bowel sounds are normal.     Palpations: Abdomen is soft.  Musculoskeletal:        General: Normal range of motion.     Cervical back: Normal range of motion.     Right knee: Swelling present.  Skin:    General: Skin is warm and dry.  Neurological:     Mental Status: He is alert and oriented to person, place,  and time.     Coordination: Coordination normal.  Psychiatric:        Behavior: Behavior normal. Behavior is cooperative.        Thought Content: Thought content normal.        Judgment: Judgment normal.          Patient has been counseled extensively about nutrition and exercise as well as the importance of adherence with medications and regular follow-up. The patient was given clear instructions to go to ER or return to medical center if symptoms don't improve, worsen or new problems develop. The patient verbalized understanding.   Follow-up: Return for See luke 4 weeks meter check. See me in 3 months.   Gildardo Pounds, FNP-BC [facility] and [place], [place]   04/17/2022, 1:32 PM

## 2022-04-17 NOTE — Progress Notes (Signed)
Med refill Kneel pain when walkx for more than 3 months. Kneel aching when he wakes Pain 6 Gabapetine not on med list

## 2022-04-17 NOTE — Addendum Note (Signed)
Addended by: Lois Huxley, Jeannett Senior L on: 04/17/2022 02:33 PM   Modules accepted: Orders

## 2022-04-17 NOTE — Telephone Encounter (Signed)
Rx sent with frequency.

## 2022-04-17 NOTE — Telephone Encounter (Signed)
Copied from CRM [id number]. Topic: General - Other >> Apr 17, 2022 11:31 AM Everette C wrote: Reason for CRM: The patient's pharmacy has made contact requesting to speak with a member of staff when possible   The patient's diabetic testing supplies prescription must include an expressly stated frequency in the directions   The supplies are unable to be prescribed with instructions test "as directed"   Please contact the patient's pharmacy further when possible

## 2022-04-18 LAB — CBC WITH DIFFERENTIAL/PLATELET
Basophils Absolute: 0.1 10*3/uL (ref 0.0–0.2)
Basos: 1 %
EOS (ABSOLUTE): 0.2 10*3/uL (ref 0.0–0.4)
Eos: 2 %
Hematocrit: 39.7 % (ref 37.5–51.0)
Hemoglobin: 13.3 g/dL (ref 13.0–17.7)
Immature Grans (Abs): 0 10*3/uL (ref 0.0–0.1)
Immature Granulocytes: 0 %
Lymphocytes Absolute: 4.2 10*3/uL — ABNORMAL HIGH (ref 0.7–3.1)
Lymphs: 45 %
MCH: 31.2 pg (ref 26.6–33.0)
MCHC: 33.5 g/dL (ref 31.5–35.7)
MCV: 93 fL (ref 79–97)
Monocytes Absolute: 0.8 10*3/uL (ref 0.1–0.9)
Monocytes: 9 %
Neutrophils Absolute: 4 10*3/uL (ref 1.4–7.0)
Neutrophils: 43 %
Platelets: 280 10*3/uL (ref 150–450)
RBC: 4.26 x10E6/uL (ref 4.14–5.80)
RDW: 11.6 % (ref 11.6–15.4)
WBC: 9.2 10*3/uL (ref 3.4–10.8)

## 2022-04-18 LAB — CMP14+EGFR
ALT: 49 IU/L — ABNORMAL HIGH (ref 0–44)
AST: 25 IU/L (ref 0–40)
Albumin/Globulin Ratio: 1.5 (ref 1.2–2.2)
Albumin: 4.8 g/dL (ref 3.9–4.9)
Alkaline Phosphatase: 114 IU/L (ref 44–121)
BUN/Creatinine Ratio: 15 (ref 10–24)
BUN: 13 mg/dL (ref 8–27)
Bilirubin Total: 0.8 mg/dL (ref 0.0–1.2)
CO2: 24 mmol/L (ref 20–29)
Calcium: 10.1 mg/dL (ref 8.6–10.2)
Chloride: 98 mmol/L (ref 96–106)
Creatinine, Ser: 0.87 mg/dL (ref 0.76–1.27)
Globulin, Total: 3.1 g/dL (ref 1.5–4.5)
Glucose: 234 mg/dL — ABNORMAL HIGH (ref 70–99)
Potassium: 5.1 mmol/L (ref 3.5–5.2)
Sodium: 138 mmol/L (ref 134–144)
Total Protein: 7.9 g/dL (ref 6.0–8.5)
eGFR: 96 mL/min/{1.73_m2} (ref >59)

## 2022-04-18 LAB — MICROALBUMIN / CREATININE URINE RATIO
Creatinine, Urine: 162.5 mg/dL
Microalb/Creat Ratio: 24 mg/g creat (ref 0–29)
Microalbumin, Urine: 39.7 ug/mL

## 2022-04-18 LAB — LIPID PANEL
Chol/HDL Ratio: 5.8 ratio — ABNORMAL HIGH (ref 0.0–5.0)
Cholesterol, Total: 242 mg/dL — ABNORMAL HIGH (ref 100–199)
HDL: 42 mg/dL (ref >39)
LDL Chol Calc (NIH): 155 mg/dL — ABNORMAL HIGH (ref 0–99)
Triglycerides: 243 mg/dL — ABNORMAL HIGH (ref 0–149)
VLDL Cholesterol Cal: 45 mg/dL — ABNORMAL HIGH (ref 5–40)

## 2022-05-15 ENCOUNTER — Ambulatory Visit: Payer: Self-pay | Admitting: Nurse Practitioner

## 2022-05-23 ENCOUNTER — Ambulatory Visit: Payer: Medicare Other | Attending: Nurse Practitioner | Admitting: Pharmacist

## 2022-05-23 ENCOUNTER — Other Ambulatory Visit: Payer: Self-pay

## 2022-05-23 DIAGNOSIS — E1165 Type 2 diabetes mellitus with hyperglycemia: Secondary | ICD-10-CM

## 2022-05-23 MED ORDER — TRULICITY 0.75 MG/0.5ML ~~LOC~~ SOAJ
0.7500 mg | SUBCUTANEOUS | 1 refills | Status: DC
  Filled 2022-05-23 – 2022-07-01 (×2): qty 2, 28d supply, fill #0

## 2022-05-23 MED ORDER — OZEMPIC (0.25 OR 0.5 MG/DOSE) 2 MG/3ML ~~LOC~~ SOPN
0.2500 mg | PEN_INJECTOR | SUBCUTANEOUS | 1 refills | Status: DC
  Filled 2022-05-23: qty 3, 28d supply, fill #0
  Filled 2022-05-23: qty 3, fill #0

## 2022-05-23 NOTE — Progress Notes (Signed)
    S:     No chief complaint on file.  Mark Curtis is a 66 y.o. male who presents for diabetes evaluation, education, and management.  PMH is significant for T2DM, COPD.  Patient was referred and last seen by Primary Care Provider, Zelda, on 04/17/2022.   At last visit 04/17/2022, A1C was 8.6, patient had been out of his medications for over 2 weeks before the visit.   Today, patient arrives in good spirits and presents without any assistance.  Family/Social History:  -Fhx: diabetes -Tobacco: former smoker  Current diabetes medications include: Metformin 1000 mg BID, Glipizide 10 mg daily Current hyperlipidemia medications include: Atorvastatin 40 mg daily.  Patient reports adherence to taking all medications as prescribed.   Do you feel that your medications are working for you? yes Have you been experiencing any side effects to the medications prescribed? no Do you have any problems obtaining medications due to transportation or finances? no Insurance coverage: Medicare   Patient denies hypoglycemic events.  Reported 2 hour post-meal/random blood sugars: 170s, 180s   Patient denies nocturia (nighttime urination).  Patient denies neuropathy (nerve pain). Patient denies visual changes. Patient reports self foot exams.   Patient reported dietary habits:  -Patient reports non-adherence to diabetic diet. Tries to limit number of tortillas each day.  Patient-reported exercise habits:  -Yard work daily   O:  Lab Results  Component Value Date   HGBA1C 8.6 (A) 04/17/2022   There were no vitals filed for this visit.  Lipid Panel     Component Value Date/Time   CHOL 242 (H) 04/17/2022 1134   TRIG 243 (H) 04/17/2022 1134   HDL 42 04/17/2022 1134   CHOLHDL 5.8 (H) 04/17/2022 1134   LDLCALC 155 (H) 04/17/2022 1134   Clinical Atherosclerotic Cardiovascular Disease (ASCVD): No  The 10-year ASCVD risk score (Arnett DK, et al., 2019) is: 25.6%   Values used to calculate  the score:     Age: 5 years     Sex: Male     Is Non-Hispanic African American: No     Diabetic: Yes     Tobacco smoker: No     Systolic Blood Pressure: 119 mmHg     Is BP treated: No     HDL Cholesterol: 42 mg/dL     Total Cholesterol: 242 mg/dL   A/P: Diabetes longstanding currently uncontrolled. Patient is able to verbalize appropriate hypoglycemia management plan. Medication adherence appears appropriate. -Started GLP-1 Trulicity 0.75 mg once weekly. -Discontinued Glipizide 10 mg daily.  -Continued metformin 1000 mg twice daily.  -Patient educated on purpose, proper use, and potential adverse effects of Trulicity.  -Extensively discussed pathophysiology of diabetes, recommended lifestyle interventions, dietary effects on blood sugar control.  -Counseled on s/sx of and management of hypoglycemia.  -Next A1c anticipated 07/2022.   Written patient instructions provided. Patient verbalized understanding of treatment plan.  Total time in face to face counseling 30 minutes.    Follow-up:  -Follow up with pharmacist in a month and half  Patient seen with: Lajean Saver, PharmD Candidate [employer]  [employer] 3016    Pharmacist:  Butch Penny, PharmD, [place], CPP Clinical Pharmacist [facility] [phone]

## 2022-05-30 ENCOUNTER — Other Ambulatory Visit: Payer: Self-pay

## 2022-07-01 ENCOUNTER — Other Ambulatory Visit: Payer: Self-pay

## 2022-07-01 ENCOUNTER — Ambulatory Visit: Payer: Medicare Other | Attending: Nurse Practitioner | Admitting: Pharmacist

## 2022-07-01 DIAGNOSIS — E1165 Type 2 diabetes mellitus with hyperglycemia: Secondary | ICD-10-CM | POA: Diagnosis not present

## 2022-07-01 DIAGNOSIS — Z79899 Other long term (current) drug therapy: Secondary | ICD-10-CM | POA: Insufficient documentation

## 2022-07-01 DIAGNOSIS — E785 Hyperlipidemia, unspecified: Secondary | ICD-10-CM | POA: Diagnosis not present

## 2022-07-01 DIAGNOSIS — E119 Type 2 diabetes mellitus without complications: Secondary | ICD-10-CM | POA: Diagnosis present

## 2022-07-01 DIAGNOSIS — Z7985 Long-term (current) use of injectable non-insulin antidiabetic drugs: Secondary | ICD-10-CM | POA: Diagnosis not present

## 2022-07-01 DIAGNOSIS — Z7984 Long term (current) use of oral hypoglycemic drugs: Secondary | ICD-10-CM | POA: Diagnosis not present

## 2022-07-01 DIAGNOSIS — Z87891 Personal history of nicotine dependence: Secondary | ICD-10-CM | POA: Diagnosis not present

## 2022-07-01 DIAGNOSIS — J449 Chronic obstructive pulmonary disease, unspecified: Secondary | ICD-10-CM | POA: Diagnosis not present

## 2022-07-01 NOTE — Progress Notes (Signed)
    S:    Mark Curtis is a 66 y.o. male who presents for diabetes evaluation, education, and management. PMH is significant for T2DM, COPD.  Patient was referred and last seen by Primary Care Provider, Zelda, on 04/17/2022. Last seen by pharmacy team on 05/23/2022.  At last visit, glipizide was discontinued and Trulicity 1.75 mg weekly was started.   Today, patient arrives in good spirits and presents with the assistance of his son to translate. Patient states he not start Trulicity due to 'feeling fine' on metformin monotherapy. However, FBG have increased from 170-180s to >200.   Family/Social History:  -Fhx: diabetes -Tobacco: former smoker  Current diabetes medications include: Metformin 1025 mg BID, Trulicity 8.52 mg weekly (not started) Current hyperlipidemia medications include: Atorvastatin 40 mg daily.  Patient denies adherence to taking all medications as prescribed. Has not started Trulicity. Insurance coverage: Medicare   Patient denies hypoglycemic events.  Reported fasting blood sugars: today FBG 234 mg/dL. Not checking after meals.   Patient denies nocturia (nighttime urination).  Patient denies neuropathy (nerve pain). Patient denies visual changes. Patient reports self foot exams.   Patient reported dietary habits:  -Patient reports non-adherence to diabetic diet. Tries to limit number of tortillas each day.  Patient-reported exercise habits:  -Yard work daily   O:  Lab Results  Component Value Date   HGBA1C 8.6 (A) 04/17/2022   There were no vitals filed for this visit.  Lipid Panel     Component Value Date/Time   CHOL 242 (H) 04/17/2022 1134   TRIG 243 (H) 04/17/2022 1134   HDL 42 04/17/2022 1134   CHOLHDL 5.8 (H) 04/17/2022 1134   LDLCALC 155 (H) 04/17/2022 1134   Clinical Atherosclerotic Cardiovascular Disease (ASCVD): No  The 10-year ASCVD risk score (Arnett DK, et al., 2019) is: 25.6%   Values used to calculate the score:     Age: 47 years      Sex: Male     Is Non-Hispanic African American: No     Diabetic: Yes     Tobacco smoker: No     Systolic Blood Pressure: 778 mmHg     Is BP treated: No     HDL Cholesterol: 42 mg/dL     Total Cholesterol: 242 mg/dL   A/P: Diabetes longstanding currently uncontrolled. Patient is able to verbalize appropriate hypoglycemia management plan. Medication adherence not ideal as patient has not started Trulicity. Gave patient the option to restart glipizide or start Trulicity and patient elected to start Trulicity.  -Instructed patient to pick up and start GLP-1 Trulicity 2.42 mg once weekly.  -Continued metformin 1000 mg twice daily.  -Patient educated on purpose, proper use, and potential adverse effects of Trulicity.  -Extensively discussed pathophysiology of diabetes, recommended lifestyle interventions, dietary effects on blood sugar control.  -Counseled on s/sx of and management of hypoglycemia.  -Next A1c anticipated 07/2022.   Written patient instructions provided. Patient verbalized understanding of treatment plan.  Total time in face to face counseling 30 minutes.    Follow-up:  -Follow up with PCP on 07/22/2022  Joseph Art, Pharm.D. PGY-2 Ambulatory Care Pharmacy Resident 07/01/2022 8:26 AM

## 2022-07-04 ENCOUNTER — Other Ambulatory Visit: Payer: Self-pay

## 2022-07-22 ENCOUNTER — Ambulatory Visit: Payer: Self-pay | Admitting: Nurse Practitioner

## 2022-12-05 ENCOUNTER — Telehealth: Payer: Self-pay | Admitting: Nurse Practitioner

## 2022-12-05 NOTE — Telephone Encounter (Signed)
Called patient to schedule Medicare Annual Wellness Visit (AWV). Left message for patient to call back and schedule Medicare Annual Wellness Visit (AWV).  Last date of AWV: due awvi 10/24/22 per [facility]   If any questions, please contact me at [phone].  Thank you ,  Barkley Boards AWV direct phone # [phone]

## 2023-01-02 ENCOUNTER — Ambulatory Visit: Payer: Medicare Other | Attending: Internal Medicine | Admitting: Internal Medicine

## 2023-01-02 ENCOUNTER — Encounter: Payer: Self-pay | Admitting: Internal Medicine

## 2023-01-02 ENCOUNTER — Other Ambulatory Visit: Payer: Self-pay

## 2023-01-02 VITALS — BP 119/75 | HR 89 | Temp 97.8°F | Ht 71.0 in | Wt 184.0 lb

## 2023-01-02 DIAGNOSIS — E119 Type 2 diabetes mellitus without complications: Secondary | ICD-10-CM | POA: Diagnosis present

## 2023-01-02 DIAGNOSIS — Z7985 Long-term (current) use of injectable non-insulin antidiabetic drugs: Secondary | ICD-10-CM | POA: Insufficient documentation

## 2023-01-02 DIAGNOSIS — Z7984 Long term (current) use of oral hypoglycemic drugs: Secondary | ICD-10-CM | POA: Diagnosis not present

## 2023-01-02 DIAGNOSIS — Z760 Encounter for issue of repeat prescription: Secondary | ICD-10-CM | POA: Insufficient documentation

## 2023-01-02 DIAGNOSIS — J449 Chronic obstructive pulmonary disease, unspecified: Secondary | ICD-10-CM | POA: Diagnosis not present

## 2023-01-02 DIAGNOSIS — M25562 Pain in left knee: Secondary | ICD-10-CM | POA: Diagnosis not present

## 2023-01-02 DIAGNOSIS — E785 Hyperlipidemia, unspecified: Secondary | ICD-10-CM | POA: Insufficient documentation

## 2023-01-02 DIAGNOSIS — M25561 Pain in right knee: Secondary | ICD-10-CM | POA: Insufficient documentation

## 2023-01-02 DIAGNOSIS — E1165 Type 2 diabetes mellitus with hyperglycemia: Secondary | ICD-10-CM | POA: Insufficient documentation

## 2023-01-02 DIAGNOSIS — Z79899 Other long term (current) drug therapy: Secondary | ICD-10-CM | POA: Diagnosis not present

## 2023-01-02 LAB — GLUCOSE, POCT (MANUAL RESULT ENTRY): POC Glucose: 336 mg/dl — AB (ref 70–99)

## 2023-01-02 LAB — POCT GLYCOSYLATED HEMOGLOBIN (HGB A1C): HbA1c, POC (controlled diabetic range): 9.3 % — AB (ref 0.0–7.0)

## 2023-01-02 MED ORDER — TRULICITY 0.75 MG/0.5ML ~~LOC~~ SOAJ
0.7500 mg | SUBCUTANEOUS | 1 refills | Status: DC
Start: 2023-01-02 — End: 2023-02-20
  Filled 2023-01-02: qty 2, 28d supply, fill #0
  Filled 2023-01-25 – 2023-01-27 (×2): qty 2, 28d supply, fill #1

## 2023-01-02 MED ORDER — METFORMIN HCL 1000 MG PO TABS
1000.0000 mg | ORAL_TABLET | Freq: Two times a day (BID) | ORAL | 6 refills | Status: DC
Start: 2023-01-02 — End: 2023-02-20
  Filled 2023-01-02: qty 60, 30d supply, fill #0
  Filled 2023-01-25 – 2023-01-27 (×2): qty 60, 30d supply, fill #1

## 2023-01-02 NOTE — Progress Notes (Signed)
Patient ID: Mark Schultzablo Norgren, male    DOB: 03-30-1956  MRN: [id number]  CC: Diabetes (DM & med refill. )   Subjective: Mark Curtis is a 67 y.o. male who presents for f/u DM and med RF.  PCP is Bertram DenverZelda Fleming.  Last seen 03/2022.  Pt's grandson, Tinnie GensJeffrey who is 66 yrs old, is with him.  Pt declines using our video interpreter.  Request using his GS.  We had him sign our wavier form waving his right to use our professional interpreter via video.   His concerns today include:  Patient with history of diabetes, HL, COPD, chronic BL knee pain and tobacco dependence  DM: Results for orders placed or performed in visit on 01/02/23  POCT glucose (manual entry)  Result Value Ref Range   POC Glucose 336 (A) 70 - 99 mg/dl  POCT glycosylated hemoglobin (Hb A1C)  Result Value Ref Range   Hemoglobin A1C     HbA1c POC (<> result, manual entry)     HbA1c, POC (prediabetic range)     HbA1c, POC (controlled diabetic range) 9.3 (A) 0.0 - 7.0 %  A1C in 03/2022 was 8.6.  Patient was seen by the clinical pharmacist 06/2022.  Patient was started on Trulicity 0.7 mg once a week and told to continue metformin 1 g twice a day.  Reports taking both consistently but out of Trulicity for 1 mth.  [place] in [place] 5 mths; just got back Checks BS occasionally.  Drank a regular soda before coming today.  Feels he does okay with eating habits.  Did a lot of walking while in [place]. Reports he still has some Lipitor and is taking. Patient Active Problem List   Diagnosis Date Noted   Tobacco use 09/02/2021   Dyspnea 09/01/2021   Type 2 diabetes mellitus 07/11/2017     Current Outpatient Medications on File Prior to Visit  Medication Sig Dispense Refill   albuterol (VENTOLIN HFA) 108 (90 Base) MCG/ACT inhaler Inhale 2 puffs into the lungs every 4 (four) hours as needed for wheezing or shortness of breath. NEEDS PASS 18 g 3   atorvastatin (LIPITOR) 40 MG tablet Take 1 tablet (40 mg total) by mouth daily at 6 PM.  NEEDS PASS 90 tablet 1   fluticasone furoate-vilanterol (BREO ELLIPTA) 200-25 MCG/ACT AEPB Inhale 1 puff into the lungs daily. NEEDS PASS 60 each 11   glucose blood (TRUE METRIX BLOOD GLUCOSE TEST) test strip Use to check blood sugar once daily. 100 each 3   ipratropium-albuterol (DUONEB) 0.5-2.5 (3) MG/3ML SOLN Take 3 mLs by nebulization every 6 (six) hours as needed. 360 mL 6   TRUEplus Lancets 28G MISC Use to check blood sugar once daily. 100 each 3   lisinopril (ZESTRIL) 2.5 MG tablet Take 1 tablet (2.5 mg total) by mouth daily. (Patient not taking: Reported on 01/02/2023) 90 tablet 1   meloxicam (MOBIC) 7.5 MG tablet Take 1 tablet (7.5 mg total) by mouth daily. FOR KNEE PAIN (Patient not taking: Reported on 01/02/2023) 30 tablet 1   No current facility-administered medications on file prior to visit.    No Known Allergies  Social History   Socioeconomic History   Marital status: Married    Spouse name: Not on file   Number of children: Not on file   Years of education: Not on file   Highest education level: Not on file  Occupational History   Not on file  Tobacco Use   Smoking status: Former  Packs/day: 0.25    Years: 30.00    Additional pack years: 0.00    Total pack years: 7.50    Types: Cigarettes    Quit date: 08/31/2021    Years since quitting: 1.3   Smokeless tobacco: Current    Types: Chew  Vaping Use   Vaping Use: Never used  Substance and Sexual Activity   Alcohol use: No   Drug use: No   Sexual activity: Not Currently  Other Topics Concern   Not on file  Social History Narrative   Not on file   Social Determinants of Health   Financial Resource Strain: Not on file  Food Insecurity: Not on file  Transportation Needs: Not on file  Physical Activity: Not on file  Stress: Not on file  Social Connections: Not on file  Intimate Partner Violence: Not on file    Family History  Problem Relation Age of Onset   Diabetes Father     History reviewed. No  pertinent surgical history.  ROS: Review of Systems Negative except as stated above  PHYSICAL EXAM: BP 119/75 (BP Location: Left Arm, Patient Position: Sitting, Cuff Size: Normal)   Pulse 89   Temp 97.8 F (36.6 C) (Oral)   Ht 5\' 11"  (1.803 m)   Wt 184 lb (83.5 kg)   SpO2 96%   BMI 25.66 kg/m   Physical Exam   General appearance - alert, well appearing, and in no distress Mental status - normal mood, behavior, speech, dress, motor activity, and thought processes Chest - clear to auscultation, no wheezes, rales or rhonchi, symmetric air entry Heart - normal rate, regular rhythm, normal S1, S2, no murmurs, rubs, clicks or gallops Extremities - peripheral pulses normal, no pedal edema, no clubbing or cyanosis     Latest Ref Rng & Units 04/17/2022   11:34 AM 09/02/2021    6:39 AM 09/01/2021    6:10 PM  CMP  Glucose 70 - 99 mg/dL 484  720  721   BUN 8 - 27 mg/dL 13  17  10    Creatinine 0.76 - 1.27 mg/dL 8.28  8.33  7.44   Sodium 134 - 144 mmol/L 138  133  135   Potassium 3.5 - 5.2 mmol/L 5.1  4.6  4.1   Chloride 96 - 106 mmol/L 98  97  98   CO2 20 - 29 mmol/L 24  25  27    Calcium 8.6 - 10.2 mg/dL 51.4  9.2  9.5   Total Protein 6.0 - 8.5 g/dL 7.9   8.6   Total Bilirubin 0.0 - 1.2 mg/dL 0.8   0.7   Alkaline Phos 44 - 121 IU/L 114   105   AST 0 - 40 IU/L 25   16   ALT 0 - 44 IU/L 49   33    Lipid Panel     Component Value Date/Time   CHOL 242 (H) 04/17/2022 1134   TRIG 243 (H) 04/17/2022 1134   HDL 42 04/17/2022 1134   CHOLHDL 5.8 (H) 04/17/2022 1134   LDLCALC 155 (H) 04/17/2022 1134    CBC    Component Value Date/Time   WBC 9.2 04/17/2022 1134   WBC 7.4 09/02/2021 0639   RBC 4.26 04/17/2022 1134   RBC 4.40 09/02/2021 0639   HGB 13.3 04/17/2022 1134   HCT 39.7 04/17/2022 1134   PLT 280 04/17/2022 1134   MCV 93 04/17/2022 1134   MCH 31.2 04/17/2022 1134   MCH 31.8 09/02/2021 0639  MCHC 33.5 04/17/2022 1134   MCHC 33.5 09/02/2021 0639   RDW 11.6 04/17/2022  1134   LYMPHSABS 4.2 (H) 04/17/2022 1134   MONOABS 1.6 (H) 09/01/2021 1810   EOSABS 0.2 04/17/2022 1134   BASOSABS 0.1 04/17/2022 1134    ASSESSMENT AND PLAN:  1. Type 2 diabetes mellitus with hyperglycemia, without long-term current use of insulin Patient advised to eliminate sugary drinks from the diet, cut back on portion sizes especially of white carbohydrates, eat more white lean meat like chicken [place] and seafood instead of beef or pork and incorporate fresh fruits and vegetables into the diet daily. -Encourage patient to check blood sugars at least once a day before breakfast with goal being 90-130. -Refill sent on Trulicity 0.75 mg once a week.  Advised patient that if he develops any persistent nausea or if he develops vomiting, diarrhea, abdominal pain on the Trulicity, he should stop it and give [place] a call. Continue metformin. - POCT glucose (manual entry) - POCT glycosylated hemoglobin (Hb A1C)    Patient was given the opportunity to ask questions.  Patient verbalized understanding of the plan and was able to repeat key elements of the plan.   This documentation was completed using Paediatric nurse.  Any transcriptional errors are unintentional.  Orders Placed This Encounter  Procedures   Basic Metabolic Panel   POCT glucose (manual entry)   POCT glycosylated hemoglobin (Hb A1C)     Requested Prescriptions   Signed Prescriptions Disp Refills   metFORMIN (GLUCOPHAGE) 1000 MG tablet 60 tablet 6    Sig: Take 1 tablet (1,000 mg total) by mouth 2 (two) times daily with a meal. NEEDS PASS   Dulaglutide (TRULICITY) 0.75 MG/0.5ML SOPN 2 mL 1    Sig: Inject 0.75 mg into the skin once a week.    Return in about 7 weeks (around 02/20/2023) for Give 6 wks f/u with his PCP Meredeth Ide.  Jonah Blue, MD, FACP

## 2023-01-03 LAB — BASIC METABOLIC PANEL
BUN/Creatinine Ratio: 15 (ref 10–24)
BUN: 11 mg/dL (ref 8–27)
CO2: 22 mmol/L (ref 20–29)
Calcium: 10.1 mg/dL (ref 8.6–10.2)
Chloride: 96 mmol/L (ref 96–106)
Creatinine, Ser: 0.71 mg/dL — ABNORMAL LOW (ref 0.76–1.27)
Glucose: 328 mg/dL — ABNORMAL HIGH (ref 70–99)
Potassium: 4.9 mmol/L (ref 3.5–5.2)
Sodium: 134 mmol/L (ref 134–144)
eGFR: 101 mL/min/{1.73_m2} (ref >59)

## 2023-01-27 ENCOUNTER — Other Ambulatory Visit: Payer: Self-pay

## 2023-01-30 ENCOUNTER — Other Ambulatory Visit: Payer: Self-pay

## 2023-02-11 ENCOUNTER — Other Ambulatory Visit: Payer: Self-pay

## 2023-02-20 ENCOUNTER — Other Ambulatory Visit: Payer: Self-pay

## 2023-02-20 ENCOUNTER — Encounter: Payer: Self-pay | Admitting: Nurse Practitioner

## 2023-02-20 ENCOUNTER — Ambulatory Visit: Payer: Medicare Other | Attending: Nurse Practitioner | Admitting: Nurse Practitioner

## 2023-02-20 VITALS — BP 128/80 | HR 93 | Ht 71.0 in | Wt 192.0 lb

## 2023-02-20 DIAGNOSIS — E782 Mixed hyperlipidemia: Secondary | ICD-10-CM | POA: Diagnosis not present

## 2023-02-20 DIAGNOSIS — Z794 Long term (current) use of insulin: Secondary | ICD-10-CM

## 2023-02-20 DIAGNOSIS — Z760 Encounter for issue of repeat prescription: Secondary | ICD-10-CM | POA: Diagnosis not present

## 2023-02-20 DIAGNOSIS — Z7985 Long-term (current) use of injectable non-insulin antidiabetic drugs: Secondary | ICD-10-CM | POA: Insufficient documentation

## 2023-02-20 DIAGNOSIS — Z79899 Other long term (current) drug therapy: Secondary | ICD-10-CM | POA: Insufficient documentation

## 2023-02-20 DIAGNOSIS — Z7984 Long term (current) use of oral hypoglycemic drugs: Secondary | ICD-10-CM | POA: Insufficient documentation

## 2023-02-20 DIAGNOSIS — Z23 Encounter for immunization: Secondary | ICD-10-CM

## 2023-02-20 DIAGNOSIS — M542 Cervicalgia: Secondary | ICD-10-CM

## 2023-02-20 DIAGNOSIS — E1165 Type 2 diabetes mellitus with hyperglycemia: Secondary | ICD-10-CM

## 2023-02-20 DIAGNOSIS — Z1211 Encounter for screening for malignant neoplasm of colon: Secondary | ICD-10-CM

## 2023-02-20 MED ORDER — ATORVASTATIN CALCIUM 40 MG PO TABS
40.0000 mg | ORAL_TABLET | Freq: Every day | ORAL | 1 refills | Status: DC
Start: 2023-02-20 — End: 2023-09-15
  Filled 2023-02-20: qty 90, 90d supply, fill #0

## 2023-02-20 MED ORDER — TRULICITY 0.75 MG/0.5ML ~~LOC~~ SOAJ
0.7500 mg | SUBCUTANEOUS | 3 refills | Status: DC
Start: 2023-02-20 — End: 2023-09-15
  Filled 2023-02-20: qty 2, 28d supply, fill #0
  Filled 2023-04-04: qty 2, 28d supply, fill #1
  Filled 2023-05-02: qty 2, 28d supply, fill #2
  Filled 2023-05-25 – 2023-05-27 (×2): qty 2, 28d supply, fill #3

## 2023-02-20 MED ORDER — TIZANIDINE HCL 4 MG PO TABS
4.0000 mg | ORAL_TABLET | Freq: Four times a day (QID) | ORAL | 0 refills | Status: DC | PRN
Start: 2023-02-20 — End: 2023-12-01
  Filled 2023-02-20: qty 30, 8d supply, fill #0

## 2023-02-20 MED ORDER — MELOXICAM 7.5 MG PO TABS
7.5000 mg | ORAL_TABLET | Freq: Every day | ORAL | 0 refills | Status: DC
Start: 2023-02-20 — End: 2023-12-01
  Filled 2023-02-20: qty 30, 30d supply, fill #0

## 2023-02-20 MED ORDER — ZOSTER VAC RECOMB ADJUVANTED 50 MCG/0.5ML IM SUSR
0.5000 mL | Freq: Once | INTRAMUSCULAR | 0 refills | Status: AC
Start: 2023-02-20 — End: 2023-02-21
  Filled 2023-02-20: qty 0.5, 1d supply, fill #0

## 2023-02-20 MED ORDER — METFORMIN HCL 1000 MG PO TABS
1000.0000 mg | ORAL_TABLET | Freq: Two times a day (BID) | ORAL | 6 refills | Status: DC
Start: 2023-02-20 — End: 2023-09-15
  Filled 2023-02-20: qty 60, 30d supply, fill #0
  Filled 2023-04-04: qty 60, 30d supply, fill #1
  Filled 2023-05-02: qty 60, 30d supply, fill #2
  Filled 2023-05-25 – 2023-05-27 (×2): qty 60, 30d supply, fill #3

## 2023-02-20 NOTE — Progress Notes (Signed)
Assessment & Plan:  Mark Curtis was seen today for neck pain.  Diagnoses and all orders for this visit:  Neck pain Recommend frequent heat application -     meloxicam (MOBIC) 7.5 MG tablet; Take 1 tablet (7.5 mg total) by mouth daily. FOR joint pAIN -     tiZANidine (ZANAFLEX) 4 MG tablet; Take 1 tablet (4 mg total) by mouth every 6 (six) hours as needed for muscle spasms.  Need for shingles vaccine -     Zoster Vaccine Adjuvanted [facility]) injection; Inject 0.5 mLs into the muscle once for 1 dose.  Colon cancer screening -     Fecal occult blood, imunochemical  Type 2 diabetes mellitus with hyperglycemia, without long-term current use of insulin (HCC) -     metFORMIN (GLUCOPHAGE) 1000 MG tablet; Take 1 tablet (1,000 mg total) by mouth 2 (two) times daily with a meal. -     Dulaglutide (TRULICITY) 0.75 MG/0.5ML SOPN; Inject 0.75 mg into the skin once a week.  Mixed hyperlipidemia -     atorvastatin (LIPITOR) 40 MG tablet; Take 1 tablet (40 mg total) by mouth daily at 6 PM. FOR CHOLESTEROL  Need for Tdap vaccination -     Tdap vaccine greater than or equal to 7yo IM    Patient has been counseled on age-appropriate routine health concerns for screening and prevention. These are reviewed and up-to-date. Referrals have been placed accordingly. Immunizations are up-to-date or declined.    Subjective:   Chief Complaint  Patient presents with   Neck Pain   HPI Mark Curtis 67 y.o. male presents to office today for neck pain and medication refills.  He is accompanied by his son who is interpreting for him today at his request. Waiver was signed by Mr. Lestine Mount for official interpreter    Neck Pain: Event that precipitate these symptoms: none known. Onset of symptoms 2 days ago, unchanged since that time. Current symptoms are stiffness and pain in posterior cervical region and bilateral trapezius (aching and tight band in character; 7/10 in severity) . Patient denies  back pain, headache  or fever . Patient has had no prior neck problems.  Previous treatments include: none.   The 10-year ASCVD risk score (Arnett DK, et al., 2019) is: 30.2%   Values used to calculate the score:     Age: 18 years     Sex: Male     Is Non-Hispanic African American: No     Diabetic: Yes     Tobacco smoker: No     Systolic Blood Pressure: 128 mmHg     Is BP treated: No     HDL Cholesterol: 42 mg/dL     Total Cholesterol: 242 mg/dL  Review of Systems  Constitutional:  Negative for fever, malaise/fatigue and weight loss.  HENT: Negative.  Negative for nosebleeds.   Eyes: Negative.  Negative for blurred vision, double vision and photophobia.  Respiratory: Negative.  Negative for cough and shortness of breath.   Cardiovascular: Negative.  Negative for chest pain, palpitations and leg swelling.  Gastrointestinal: Negative.  Negative for heartburn, nausea and vomiting.  Musculoskeletal:  Positive for neck pain. Negative for myalgias.  Neurological: Negative.  Negative for dizziness, focal weakness, seizures and headaches.  Psychiatric/Behavioral: Negative.  Negative for suicidal ideas.     Past Medical History:  Diagnosis Date   Chronic headaches    COPD (chronic obstructive pulmonary disease) (HCC)    DM (diabetes mellitus) (HCC)    TYPE 2  Spell of dizziness    WHEN STANDING    History reviewed. No pertinent surgical history.  Family History  Problem Relation Age of Onset   Diabetes Father     Social History Reviewed with no changes to be made today.   Outpatient Medications Prior to Visit  Medication Sig Dispense Refill   albuterol (VENTOLIN HFA) 108 (90 Base) MCG/ACT inhaler Inhale 2 puffs into the lungs every 4 (four) hours as needed for wheezing or shortness of breath. NEEDS PASS 18 g 3   fluticasone furoate-vilanterol (BREO ELLIPTA) 200-25 MCG/ACT AEPB Inhale 1 puff into the lungs daily. NEEDS PASS 60 each 11   glucose blood (TRUE METRIX BLOOD GLUCOSE TEST) test strip Use  to check blood sugar once daily. 100 each 3   ipratropium-albuterol (DUONEB) 0.5-2.5 (3) MG/3ML SOLN Take 3 mLs by nebulization every 6 (six) hours as needed. 360 mL 6   TRUEplus Lancets 28G MISC Use to check blood sugar once daily. 100 each 3   Dulaglutide (TRULICITY) 0.75 MG/0.5ML SOPN Inject 0.75 mg into the skin once a week. 2 mL 1   metFORMIN (GLUCOPHAGE) 1000 MG tablet Take 1 tablet (1,000 mg total) by mouth 2 (two) times daily with a meal. 60 tablet 6   lisinopril (ZESTRIL) 2.5 MG tablet Take 1 tablet (2.5 mg total) by mouth daily. (Patient not taking: Reported on 01/02/2023) 90 tablet 1   atorvastatin (LIPITOR) 40 MG tablet Take 1 tablet (40 mg total) by mouth daily at 6 PM. NEEDS PASS (Patient not taking: Reported on 02/20/2023) 90 tablet 1   meloxicam (MOBIC) 7.5 MG tablet Take 1 tablet (7.5 mg total) by mouth daily. FOR KNEE PAIN (Patient not taking: Reported on 01/02/2023) 30 tablet 1   No facility-administered medications prior to visit.    No Known Allergies     Objective:    BP 128/80 (BP Location: Left Arm, Patient Position: Sitting, Cuff Size: Normal)   Pulse 93   Ht 5\' 11"  (1.803 m)   Wt 192 lb (87.1 kg)   SpO2 97%   BMI 26.78 kg/m  Wt Readings from Last 3 Encounters:  02/20/23 192 lb (87.1 kg)  01/02/23 184 lb (83.5 kg)  04/17/22 184 lb 9.6 oz (83.7 kg)    Physical Exam Vitals and nursing note reviewed.  Constitutional:      Appearance: He is well-developed.  HENT:     Head: Normocephalic and atraumatic.  Cardiovascular:     Rate and Rhythm: Normal rate and regular rhythm.     Heart sounds: Normal heart sounds. No murmur heard.    No friction rub. No gallop.  Pulmonary:     Effort: Pulmonary effort is normal. No tachypnea or respiratory distress.     Breath sounds: Normal breath sounds. No decreased breath sounds, wheezing, rhonchi or rales.  Chest:     Chest wall: No tenderness.  Abdominal:     General: Bowel sounds are normal.     Palpations: Abdomen  is soft.  Musculoskeletal:        General: Normal range of motion.     Cervical back: Normal range of motion. No edema, erythema, signs of trauma, rigidity, torticollis or crepitus. Pain with movement and muscular tenderness present. No spinous process tenderness. Normal range of motion.  Skin:    General: Skin is warm and dry.  Neurological:     Mental Status: He is alert and oriented to person, place, and time.     Coordination: Coordination normal.  Psychiatric:  Behavior: Behavior normal. Behavior is cooperative.        Thought Content: Thought content normal.        Judgment: Judgment normal.          Patient has been counseled extensively about nutrition and exercise as well as the importance of adherence with medications and regular follow-up. The patient was given clear instructions to go to ER or return to medical center if symptoms don't improve, worsen or new problems develop. The patient verbalized understanding.   Follow-up: Return in about 3 months (around 05/09/2023) for a1c.   Claiborne Rigg, FNP-BC [facility] [place], [place] [phone]   02/20/2023, 11:09 AM

## 2023-02-20 NOTE — Patient Instructions (Signed)
[facility] [employer] logo [address] [place], [address] [place], [place] [address] [phone]

## 2023-02-20 NOTE — Progress Notes (Signed)
Neck pain

## 2023-02-21 ENCOUNTER — Other Ambulatory Visit: Payer: Self-pay

## 2023-04-04 ENCOUNTER — Other Ambulatory Visit: Payer: Self-pay

## 2023-05-02 ENCOUNTER — Other Ambulatory Visit: Payer: Self-pay

## 2023-05-27 ENCOUNTER — Other Ambulatory Visit: Payer: Self-pay

## 2023-05-28 ENCOUNTER — Ambulatory Visit: Payer: Medicare Other | Admitting: Nurse Practitioner

## 2023-05-28 ENCOUNTER — Other Ambulatory Visit: Payer: Self-pay

## 2023-07-18 ENCOUNTER — Ambulatory Visit: Payer: Medicare Other | Admitting: Nurse Practitioner

## 2023-09-15 ENCOUNTER — Encounter: Payer: Self-pay | Admitting: Internal Medicine

## 2023-09-15 ENCOUNTER — Other Ambulatory Visit: Payer: Self-pay

## 2023-09-15 ENCOUNTER — Ambulatory Visit: Payer: Medicare Other | Attending: Internal Medicine | Admitting: Internal Medicine

## 2023-09-15 VITALS — BP 131/79 | HR 90 | Temp 98.2°F | Ht 71.0 in | Wt 188.0 lb

## 2023-09-15 DIAGNOSIS — Z23 Encounter for immunization: Secondary | ICD-10-CM | POA: Insufficient documentation

## 2023-09-15 DIAGNOSIS — E785 Hyperlipidemia, unspecified: Secondary | ICD-10-CM | POA: Diagnosis not present

## 2023-09-15 DIAGNOSIS — J441 Chronic obstructive pulmonary disease with (acute) exacerbation: Secondary | ICD-10-CM | POA: Insufficient documentation

## 2023-09-15 DIAGNOSIS — Z7985 Long-term (current) use of injectable non-insulin antidiabetic drugs: Secondary | ICD-10-CM | POA: Diagnosis not present

## 2023-09-15 DIAGNOSIS — Z87891 Personal history of nicotine dependence: Secondary | ICD-10-CM | POA: Insufficient documentation

## 2023-09-15 DIAGNOSIS — Z833 Family history of diabetes mellitus: Secondary | ICD-10-CM | POA: Diagnosis not present

## 2023-09-15 DIAGNOSIS — Z760 Encounter for issue of repeat prescription: Secondary | ICD-10-CM | POA: Diagnosis not present

## 2023-09-15 DIAGNOSIS — Z7984 Long term (current) use of oral hypoglycemic drugs: Secondary | ICD-10-CM | POA: Insufficient documentation

## 2023-09-15 DIAGNOSIS — E1169 Type 2 diabetes mellitus with other specified complication: Secondary | ICD-10-CM | POA: Diagnosis present

## 2023-09-15 LAB — POCT GLYCOSYLATED HEMOGLOBIN (HGB A1C): HbA1c, POC (controlled diabetic range): 9.3 % — AB (ref 0.0–7.0)

## 2023-09-15 LAB — GLUCOSE, POCT (MANUAL RESULT ENTRY): POC Glucose: 310 mg/dL — AB (ref 70–99)

## 2023-09-15 MED ORDER — TRULICITY 0.75 MG/0.5ML ~~LOC~~ SOAJ
0.7500 mg | SUBCUTANEOUS | 1 refills | Status: DC
Start: 2023-09-15 — End: 2023-11-28
  Filled 2023-09-15: qty 2, 28d supply, fill #0
  Filled 2023-11-03 (×2): qty 2, 28d supply, fill #1

## 2023-09-15 MED ORDER — TRUEPLUS LANCETS 28G MISC
1.0000 | Freq: Every day | 3 refills | Status: DC
Start: 2023-09-15 — End: 2023-12-01
  Filled 2023-09-15: qty 100, 100d supply, fill #0

## 2023-09-15 MED ORDER — ZOSTER VAC RECOMB ADJUVANTED 50 MCG/0.5ML IM SUSR: 0.5000 mL | Freq: Once | INTRAMUSCULAR | 0 refills | Status: AC

## 2023-09-15 MED ORDER — ATORVASTATIN CALCIUM 40 MG PO TABS
40.0000 mg | ORAL_TABLET | Freq: Every day | ORAL | 0 refills | Status: DC
Start: 2023-09-15 — End: 2023-12-01
  Filled 2023-09-15: qty 90, 90d supply, fill #0

## 2023-09-15 MED ORDER — METFORMIN HCL 1000 MG PO TABS
1000.0000 mg | ORAL_TABLET | Freq: Two times a day (BID) | ORAL | 0 refills | Status: DC
Start: 2023-09-15 — End: 2023-11-28
  Filled 2023-09-15: qty 180, 90d supply, fill #0

## 2023-09-15 MED ORDER — TRUE METRIX BLOOD GLUCOSE TEST VI STRP
1.0000 | ORAL_STRIP | Freq: Every day | 3 refills | Status: DC
Start: 2023-09-15 — End: 2023-12-01
  Filled 2023-09-15: qty 100, 100d supply, fill #0

## 2023-09-15 NOTE — Progress Notes (Signed)
Patient ID: Mark Curtis, male    DOB: 1956-06-11  MRN: [id number]  CC: Medication Refill (Med refills./Requesting exemption letter for the citizenship test - pt is illiterate/Yes to flu. Yes to shingles vax)   Subjective: Mark Curtis is a 67 y.o. male who presents for chronic ds management. PCP is NP Meredeth Ide who is on vacation. Lanier Prude, is with him and interprets.  States he has signed form already in the past declining our video interpreter. His concerns today include:  Patient with history of diabetes, HL, COPD, chronic BL knee pain and tobacco dependence   DM: Results for orders placed or performed in visit on 09/15/23  POCT glucose (manual entry)   Collection Time: 09/15/23  4:22 PM  Result Value Ref Range   POC Glucose 310 (A) 70 - 99 mg/dl  POCT glycosylated hemoglobin (Hb A1C)   Collection Time: 09/15/23  4:27 PM  Result Value Ref Range   Hemoglobin A1C     HbA1c POC (<> result, manual entry)     HbA1c, POC (prediabetic range)     HbA1c, POC (controlled diabetic range) 9.3 (A) 0.0 - 7.0 %  Should be on Metformin 1 gram BID and Trulicity 0.75 mg once a wk.  Taking Metformin as prescribe but out of Trulicity for 1.5 mths.  [place] just came back 09/03/2023 No frequent thirst or urination.  Endorses blurred vision at times Taking Lipitor as prescribed for HL Lisinopril 2.5 mg on list.  However pt discontinued taking because he had a reaction to it.  Reports it caused stomach pain.  No swelling lip/tongue Stopped smoking tobacco 2 yrs ago.  Now chews tobacco.   Hx of COPD.  Uses inhalers only Albuterol PRN.  No recent flare.   Pt is permanent resident of [place]  Wants to apply for citizen but has difficulty writing/can only write his name and poor reading skills Requesting letter for immigration to be exempt from taking the exam.  HM: yes to flu shot Patient Active Problem List   Diagnosis Date Noted   Tobacco use 09/02/2021   Dyspnea 09/01/2021   Type  2 diabetes mellitus (HCC) 07/11/2017     Current Outpatient Medications on File Prior to Visit  Medication Sig Dispense Refill   albuterol (VENTOLIN HFA) 108 (90 Base) MCG/ACT inhaler Inhale 2 puffs into the lungs every 4 (four) hours as needed for wheezing or shortness of breath. NEEDS PASS 18 g 3   fluticasone furoate-vilanterol (BREO ELLIPTA) 200-25 MCG/ACT AEPB Inhale 1 puff into the lungs daily. NEEDS PASS 60 each 11   ipratropium-albuterol (DUONEB) 0.5-2.5 (3) MG/3ML SOLN Take 3 mLs by nebulization every 6 (six) hours as needed. 360 mL 6   meloxicam (MOBIC) 7.5 MG tablet Take 1 tablet (7.5 mg total) by mouth daily. FOR joint pAIN 30 tablet 0   tiZANidine (ZANAFLEX) 4 MG tablet Take 1 tablet (4 mg total) by mouth every 6 (six) hours as needed for muscle spasms. 30 tablet 0   No current facility-administered medications on file prior to visit.    Allergies  Allergen Reactions   Lisinopril Other (See Comments)    Unset stomach    Social History   Socioeconomic History   Marital status: Married    Spouse name: Not on file   Number of children: Not on file   Years of education: Not on file   Highest education level: Not on file  Occupational History   Not on file  Tobacco  Use   Smoking status: Former    Current packs/day: 0.00    Average packs/day: 0.3 packs/day for 30.0 years (7.5 ttl pk-yrs)    Types: Cigarettes    Start date: 09/01/1991    Quit date: 08/31/2021    Years since quitting: 2.0   Smokeless tobacco: Current    Types: Chew  Vaping Use   Vaping status: Never Used  Substance and Sexual Activity   Alcohol use: No   Drug use: No   Sexual activity: Not Currently  Other Topics Concern   Not on file  Social History Narrative   Not on file   Social Drivers of Health   Financial Resource Strain: Low Risk  (09/15/2023)   Overall Financial Resource Strain (CARDIA)    Difficulty of Paying Living Expenses: Not hard at all  Food Insecurity: No Food Insecurity  (09/15/2023)   Hunger Vital Sign    Worried About Running Out of Food in the Last Year: Never true    Ran Out of Food in the Last Year: Never true  Transportation Needs: No Transportation Needs (09/15/2023)   PRAPARE - Administrator, Civil Service (Medical): No    Lack of Transportation (Non-Medical): No  Physical Activity: Sufficiently Active (09/15/2023)   Exercise Vital Sign    Days of Exercise per Week: 3 days    Minutes of Exercise per Session: 60 min  Stress: No Stress Concern Present (09/15/2023)   [employer] of Occupational Health - Occupational Stress Questionnaire    Feeling of Stress : Not at all  Social Connections: Socially Integrated (09/15/2023)   Social Connection and Isolation Panel [NHANES]    Frequency of Communication with Friends and Family: More than three times a week    Frequency of Social Gatherings with Friends and Family: More than three times a week    Attends Religious Services: More than 4 times per year    Active Member of [employer] or Organizations: Yes    Attends Banker Meetings: 1 to 4 times per year    Marital Status: Married  Catering manager Violence: Not At Risk (09/15/2023)   Humiliation, Afraid, Rape, and Kick questionnaire    Fear of Current or Ex-Partner: No    Emotionally Abused: No    Physically Abused: No    Sexually Abused: No    Family History  Problem Relation Age of Onset   Diabetes Father     No past surgical history on file.  ROS: Review of Systems Negative except as stated above  PHYSICAL EXAM: BP 131/79   Pulse 90   Temp 98.2 F (36.8 C) (Oral)   Ht 5\' 11"  (1.803 m)   Wt 188 lb (85.3 kg)   SpO2 96%   BMI 26.22 kg/m   Physical Exam   General appearance - alert, well appearing, and in no distress Mental status - normal mood, behavior, speech, dress, motor activity, and thought processes Neck - supple, no significant adenopathy Chest - clear to auscultation, no wheezes, rales or  rhonchi, symmetric air entry Heart - normal rate, regular rhythm, normal S1, S2, no murmurs, rubs, clicks or gallops Extremities - peripheral pulses normal, no pedal edema, no clubbing or cyanosis     Latest Ref Rng & Units 01/02/2023    2:48 PM 04/17/2022   11:34 AM 09/02/2021    6:39 AM  CMP  Glucose 70 - 99 mg/dL 254  270  623   BUN 8 - 27 mg/dL 11  13  17   Creatinine 0.76 - 1.27 mg/dL 1.61  0.96  0.45   Sodium 134 - 144 mmol/L 134  138  133   Potassium 3.5 - 5.2 mmol/L 4.9  5.1  4.6   Chloride 96 - 106 mmol/L 96  98  97   CO2 20 - 29 mmol/L 22  24  25    Calcium 8.6 - 10.2 mg/dL 40.9  81.1  9.2   Total Protein 6.0 - 8.5 g/dL  7.9    Total Bilirubin 0.0 - 1.2 mg/dL  0.8    Alkaline Phos 44 - 121 IU/L  114    AST 0 - 40 IU/L  25    ALT 0 - 44 IU/L  49     Lipid Panel     Component Value Date/Time   CHOL 242 (H) 04/17/2022 1134   TRIG 243 (H) 04/17/2022 1134   HDL 42 04/17/2022 1134   CHOLHDL 5.8 (H) 04/17/2022 1134   LDLCALC 155 (H) 04/17/2022 1134    CBC    Component Value Date/Time   WBC 9.2 04/17/2022 1134   WBC 7.4 09/02/2021 0639   RBC 4.26 04/17/2022 1134   RBC 4.40 09/02/2021 0639   HGB 13.3 04/17/2022 1134   HCT 39.7 04/17/2022 1134   PLT 280 04/17/2022 1134   MCV 93 04/17/2022 1134   MCH 31.2 04/17/2022 1134   MCH 31.8 09/02/2021 0639   MCHC 33.5 04/17/2022 1134   MCHC 33.5 09/02/2021 0639   RDW 11.6 04/17/2022 1134   LYMPHSABS 4.2 (H) 04/17/2022 1134   MONOABS 1.6 (H) 09/01/2021 1810   EOSABS 0.2 04/17/2022 1134   BASOSABS 0.1 04/17/2022 1134    ASSESSMENT AND PLAN: 1. Type 2 diabetes mellitus with other specified complication, without long-term current use of insulin (HCC) (Primary) Not at goal.  Patient has been out of Trulicity for 1-1/2 months.  Refill given on Trulicity and metformin.  Encourage healthy eating habits.  Overdue for baseline blood test.  Lab is closed already for the evening.  Lucila Maine will bring him back later this week or next  week to have blood test done. - POCT glycosylated hemoglobin (Hb A1C) - POCT glucose (manual entry) - Dulaglutide (TRULICITY) 0.75 MG/0.5ML SOAJ; Inject 0.75 mg into the skin once a week.  Dispense: 2 mL; Refill: 1 - metFORMIN (GLUCOPHAGE) 1000 MG tablet; Take 1 tablet (1,000 mg total) by mouth 2 (two) times daily with a meal.  Dispense: 180 tablet; Refill: 0 - glucose blood (TRUE METRIX BLOOD GLUCOSE TEST) test strip; Use 1 each to check blood glucose once daily  Dispense: 100 each; Refill: 3 - TRUEplus Lancets 28G MISC; Use 1 each to check blood glucose once daily  Dispense: 100 each; Refill: 3 - Ambulatory referral to Ophthalmology - CBC; Future - Comprehensive metabolic panel; Future - Lipid panel; Future  2. Hyperlipidemia associated with type 2 diabetes mellitus (HCC) Continue atorvastatin.  Due for lipid profile and chemistry. - atorvastatin (LIPITOR) 40 MG tablet; Take 1 tablet (40 mg total) by mouth daily at 6 PM. FOR CHOLESTEROL  Dispense: 90 tablet; Refill: 0  3. Former smoker Commended him on quitting.  However I have also encouraged him to stop chewing tobacco as well.  4. Chronic obstructive pulmonary disease with acute exacerbation (HCC) No recent exacerbation.  Using albuterol inhaler only as needed.  Refill sent.  5. Need for shingles vaccine Printed prescription given to patient - Zoster Vaccine Adjuvanted [facility]) injection; Inject 0.5 mLs into the  muscle once for 1 dose.  Dispense: 0.5 mL; Refill: 0  6. Encounter for immunization - Flu Vaccine Trivalent High Dose (Fluad)  In regards to his request for letter to get exam from taking the citizenship exam due to poor literacy, I inform patient and grandson that they need to discuss that with his PCP.  Furthermore, to my knowledge immigration does not except illiteracy as a reason to be examined from taking the citizenship exam.  Must have a qualifying diagnosis like significant mental illness, dementia or stroke that  affects memory.  Patient was given the opportunity to ask questions.  Patient verbalized understanding of the plan and was able to repeat key elements of the plan.   This documentation was completed using Paediatric nurse.  Any transcriptional errors are unintentional.  Orders Placed This Encounter  Procedures   Flu Vaccine Trivalent High Dose (Fluad)   CBC   Comprehensive metabolic panel   Lipid panel   Ambulatory referral to Ophthalmology   POCT glycosylated hemoglobin (Hb A1C)   POCT glucose (manual entry)     Requested Prescriptions   Signed Prescriptions Disp Refills   atorvastatin (LIPITOR) 40 MG tablet 90 tablet 0    Sig: Take 1 tablet (40 mg total) by mouth daily at 6 PM. FOR CHOLESTEROL   Dulaglutide (TRULICITY) 0.75 MG/0.5ML SOAJ 2 mL 1    Sig: Inject 0.75 mg into the skin once a week.   metFORMIN (GLUCOPHAGE) 1000 MG tablet 180 tablet 0    Sig: Take 1 tablet (1,000 mg total) by mouth 2 (two) times daily with a meal.   glucose blood (TRUE METRIX BLOOD GLUCOSE TEST) test strip 100 each 3    Sig: Use 1 each to check blood glucose once daily   TRUEplus Lancets 28G MISC 100 each 3    Sig: Use 1 each to check blood glucose once daily   Zoster Vaccine Adjuvanted [facility]) injection 0.5 mL 0    Sig: Inject 0.5 mLs into the muscle once for 1 dose.    Return in about 2 months (around 11/16/2023) for Give follow up appointment with his PCP Meredeth Ide.  Jonah Blue, MD, FACP

## 2023-09-16 ENCOUNTER — Other Ambulatory Visit: Payer: Self-pay

## 2023-09-18 ENCOUNTER — Other Ambulatory Visit: Payer: Self-pay

## 2023-09-25 ENCOUNTER — Other Ambulatory Visit: Payer: Self-pay

## 2023-09-29 ENCOUNTER — Other Ambulatory Visit: Payer: Self-pay

## 2023-09-29 MED ORDER — ZOSTER VAC RECOMB ADJUVANTED 50 MCG/0.5ML IM SUSR: 0.5000 mL | Freq: Once | INTRAMUSCULAR | 0 refills | Status: AC

## 2023-10-02 ENCOUNTER — Ambulatory Visit: Payer: Medicare Other | Admitting: Physician Assistant

## 2023-11-03 ENCOUNTER — Other Ambulatory Visit: Payer: Self-pay

## 2023-11-05 ENCOUNTER — Other Ambulatory Visit: Payer: Self-pay

## 2023-11-11 IMAGING — CT CT ANGIO CHEST
2 of 7 series · 17 of 46 positions shown · IV contrast (omnipaque)
Comparison: 08/29/2016.

CLINICAL DATA: Shortness of breath. Pulmonary embolism suspected,
high probability.

EXAM:
CT ANGIOGRAPHY CHEST WITH CONTRAST
TECHNIQUE: Multidetector CT imaging of the chest was performed using the
standard protocol during bolus administration of intravenous
contrast. Multiplanar CT image reconstructions and MIPs were
obtained to evaluate the vascular anatomy.
CONTRAST:  75mL OMNIPAQUE IOHEXOL 350 MG/ML SOLN

[Series 5: thins · axial · 0.84mm/px · z∈[+1229,+1502]mm · 15 of 313 slices shown]
[im 20/313  lung]
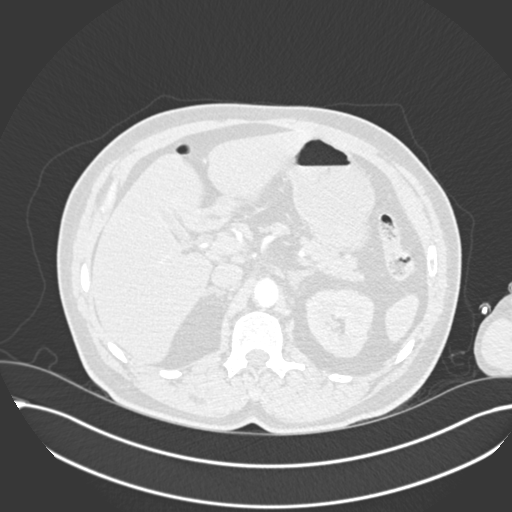
[im 40/313  soft-tissue]
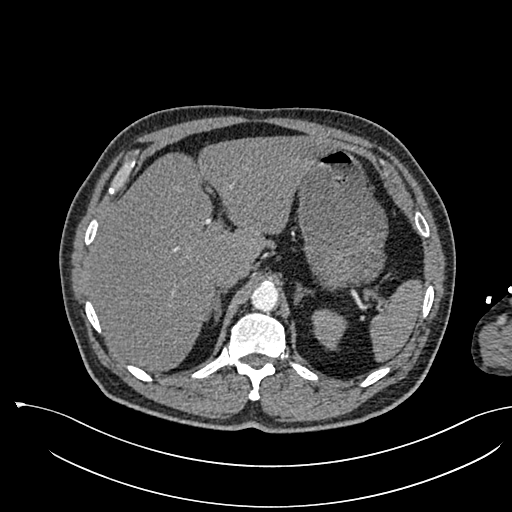
[im 59/313  lung]
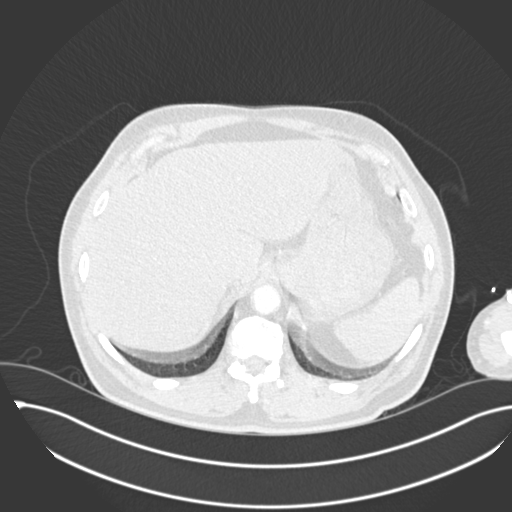
[im 79/313  soft-tissue]
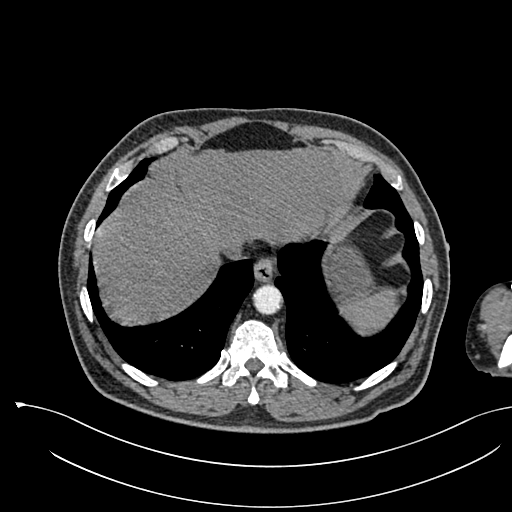
[im 98/313  lung]
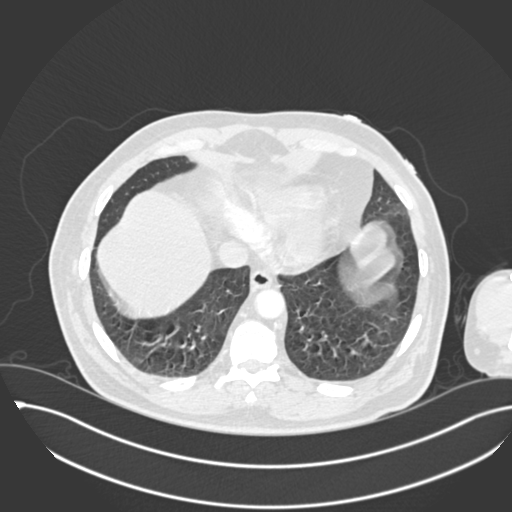
[im 118/313  soft-tissue]
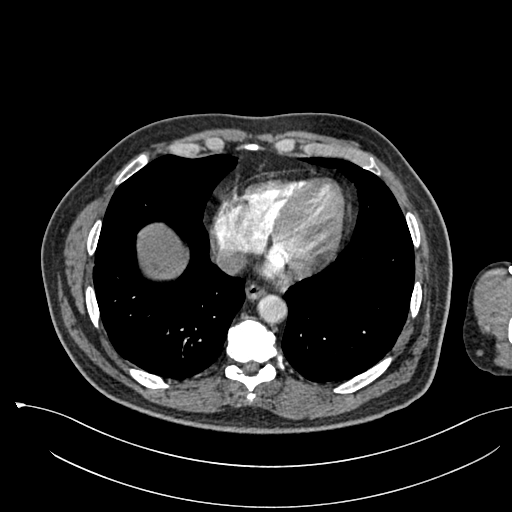
[im 137/313  lung]
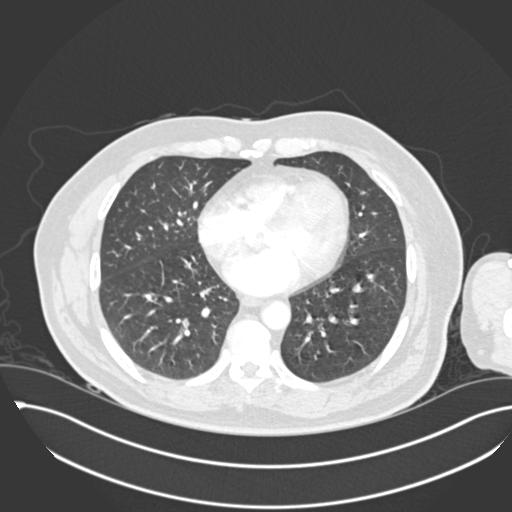
[im 157/313  soft-tissue]
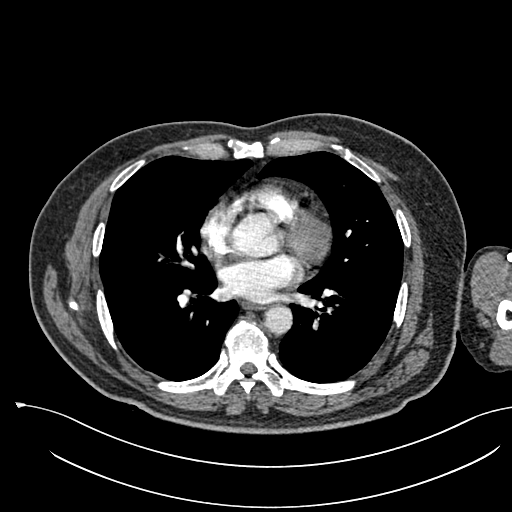
[im 176/313  lung]
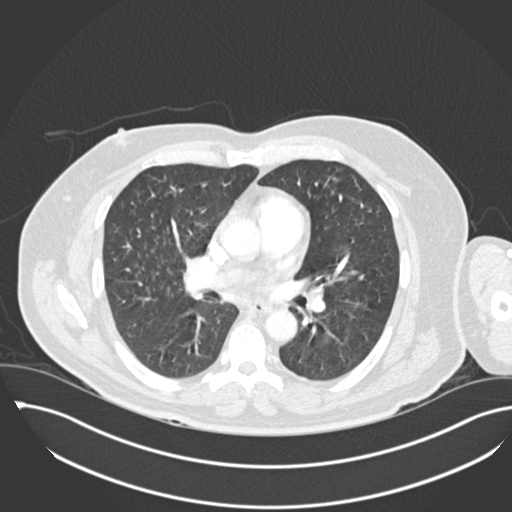
[im 196/313  soft-tissue]
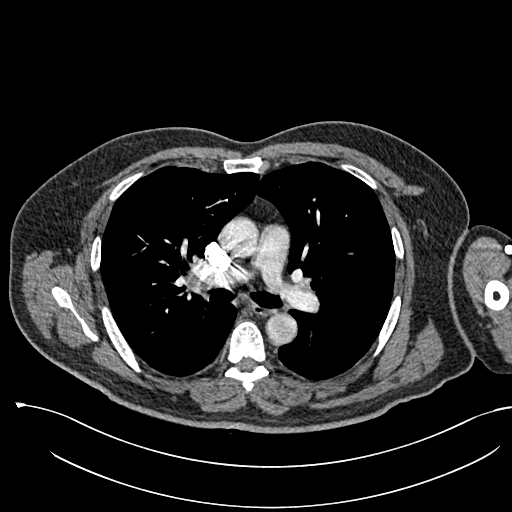
[im 215/313  lung]
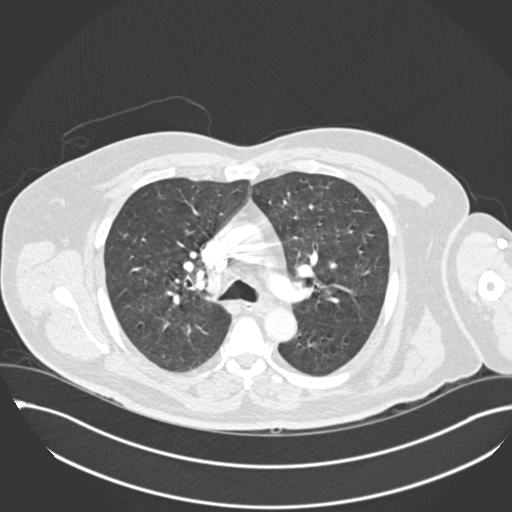
[im 235/313  soft-tissue]
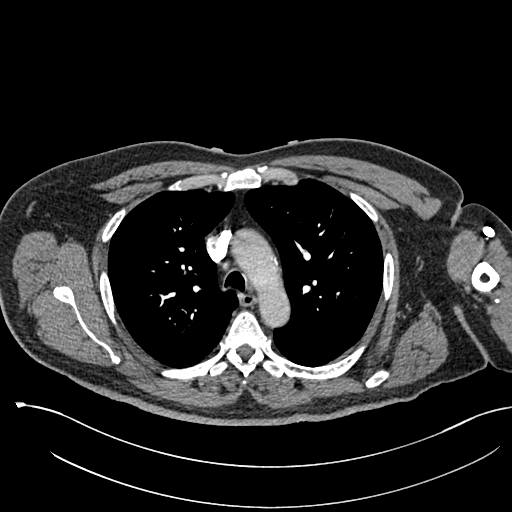
[im 254/313  lung]
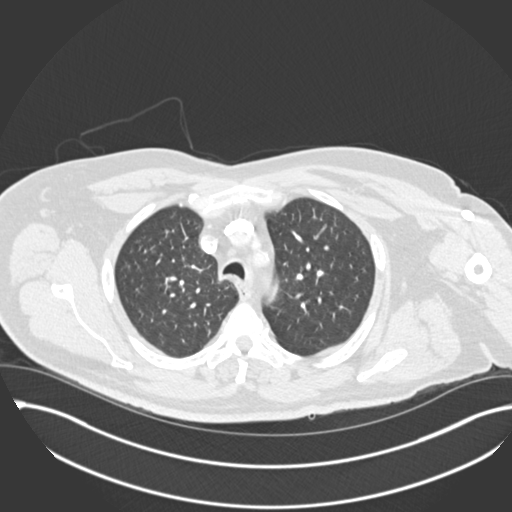
[im 274/313  soft-tissue]
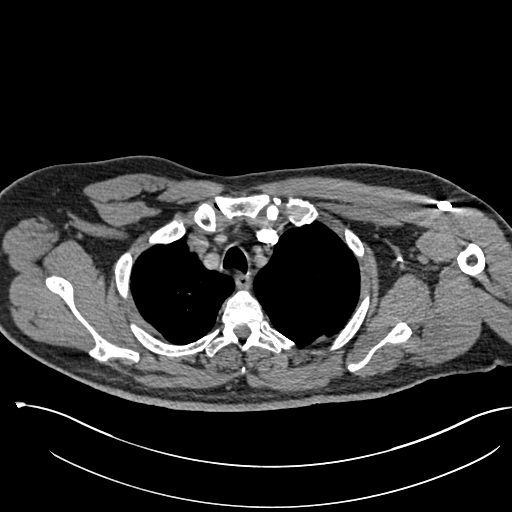
[im 293/313  lung]
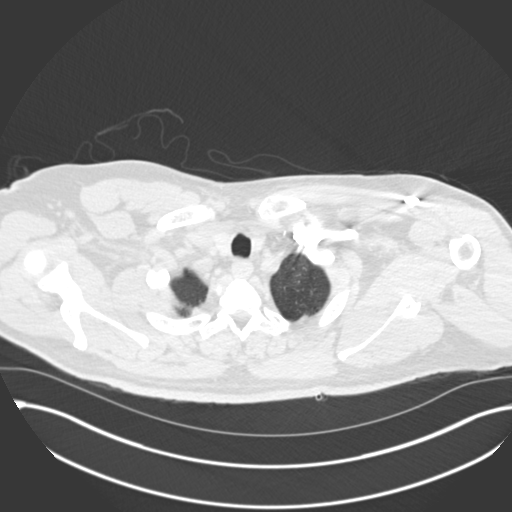

[Series 7: coronal mpr · coronal · 0.68mm/px · 2 of 106 slices shown]
[im 36/106  soft-tissue]
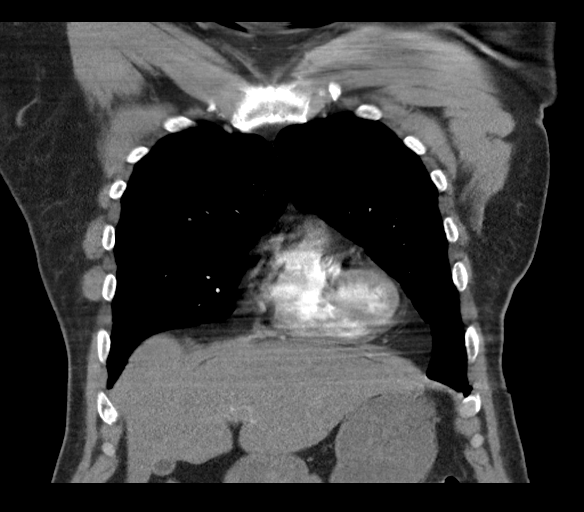
[im 71/106  soft-tissue]
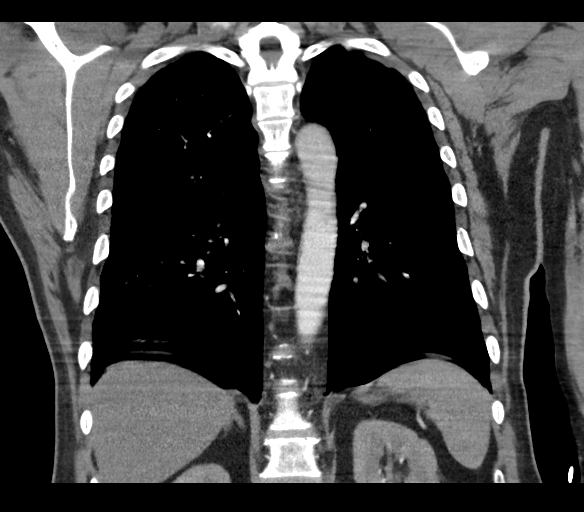

[17 of 46 positions shown; findings below may reference images not displayed]

FINDINGS: Cardiovascular: The heart is normal in size and there is a trace
pericardial effusion. Mild atherosclerotic calcification of the
aorta without evidence of aneurysm. The pulmonary trunk is normal in
caliber. No definite evidence of pulmonary embolism. Examination is
limited due to mixing artifact and respiratory motion.

Mediastinum/Nodes: Shotty lymph nodes are present in the mediastinum
and right hilum. No axillary lymphadenopathy. The thyroid gland,
trachea, and esophagus are within normal limits.

Lungs/Pleura: A few scattered tree-in-bud ground-glass nodular
opacities are noted in the upper lobes bilaterally. There is a 6 mm
nodule in the right middle lobe, axial image 99, unchanged. There is
a 4 mm nodule in the right middle lobe, axial image 96, unchanged.
No effusion or pneumothorax.

Upper Abdomen: There is diffuse fatty infiltration of the liver.
Stones are present within the gallbladder.

Musculoskeletal: Degenerative changes are present in the thoracic
spine. There is a stable sclerotic lesion at T5, likely bone island.
No acute osseous abnormality.

Review of the MIP images confirms the above findings.
IMPRESSION: 1. No definite evidence of pulmonary embolism. Examination is
limited due to mixing artifact and respiratory motion.
2. Tree-in-bud ground-glass nodular opacities in the upper lobes
bilaterally, most likely infectious or inflammatory.
3. Stable right middle lobe pulmonary nodules, unchanged from 6144.
4. Hepatic steatosis.
5. Cholelithiasis.

## 2023-11-17 ENCOUNTER — Ambulatory Visit: Payer: Medicare Other | Admitting: Nurse Practitioner

## 2023-11-28 ENCOUNTER — Other Ambulatory Visit: Payer: Self-pay

## 2023-11-28 ENCOUNTER — Other Ambulatory Visit: Payer: Self-pay | Admitting: Internal Medicine

## 2023-11-28 DIAGNOSIS — E1169 Type 2 diabetes mellitus with other specified complication: Secondary | ICD-10-CM

## 2023-11-28 MED ORDER — METFORMIN HCL 1000 MG PO TABS
1000.0000 mg | ORAL_TABLET | Freq: Two times a day (BID) | ORAL | 0 refills | Status: DC
  Filled 2023-11-28: qty 180, 90d supply, fill #0

## 2023-11-28 MED ORDER — TRULICITY 0.75 MG/0.5ML ~~LOC~~ SOAJ
0.7500 mg | SUBCUTANEOUS | 1 refills | Status: DC
Start: 2023-11-28 — End: 2024-03-02
  Filled 2023-11-28: qty 2, 28d supply, fill #0

## 2023-12-01 ENCOUNTER — Other Ambulatory Visit: Payer: Self-pay

## 2023-12-01 ENCOUNTER — Ambulatory Visit: Payer: Medicare Other | Attending: Nurse Practitioner | Admitting: Nurse Practitioner

## 2023-12-01 ENCOUNTER — Encounter: Payer: Self-pay | Admitting: Nurse Practitioner

## 2023-12-01 VITALS — BP 125/85 | HR 85 | Resp 20 | Ht 71.0 in | Wt 190.0 lb

## 2023-12-01 DIAGNOSIS — E1165 Type 2 diabetes mellitus with hyperglycemia: Secondary | ICD-10-CM | POA: Diagnosis present

## 2023-12-01 DIAGNOSIS — Z833 Family history of diabetes mellitus: Secondary | ICD-10-CM | POA: Insufficient documentation

## 2023-12-01 DIAGNOSIS — E1140 Type 2 diabetes mellitus with diabetic neuropathy, unspecified: Secondary | ICD-10-CM | POA: Diagnosis not present

## 2023-12-01 DIAGNOSIS — E785 Hyperlipidemia, unspecified: Secondary | ICD-10-CM | POA: Diagnosis not present

## 2023-12-01 DIAGNOSIS — E1159 Type 2 diabetes mellitus with other circulatory complications: Secondary | ICD-10-CM | POA: Diagnosis not present

## 2023-12-01 DIAGNOSIS — E559 Vitamin D deficiency, unspecified: Secondary | ICD-10-CM | POA: Diagnosis not present

## 2023-12-01 DIAGNOSIS — J441 Chronic obstructive pulmonary disease with (acute) exacerbation: Secondary | ICD-10-CM | POA: Insufficient documentation

## 2023-12-01 DIAGNOSIS — R350 Frequency of micturition: Secondary | ICD-10-CM | POA: Diagnosis not present

## 2023-12-01 DIAGNOSIS — E1169 Type 2 diabetes mellitus with other specified complication: Secondary | ICD-10-CM | POA: Insufficient documentation

## 2023-12-01 DIAGNOSIS — Z79899 Other long term (current) drug therapy: Secondary | ICD-10-CM | POA: Insufficient documentation

## 2023-12-01 DIAGNOSIS — Z125 Encounter for screening for malignant neoplasm of prostate: Secondary | ICD-10-CM | POA: Insufficient documentation

## 2023-12-01 DIAGNOSIS — Z7985 Long-term (current) use of injectable non-insulin antidiabetic drugs: Secondary | ICD-10-CM | POA: Diagnosis not present

## 2023-12-01 DIAGNOSIS — Z23 Encounter for immunization: Secondary | ICD-10-CM

## 2023-12-01 DIAGNOSIS — Z7984 Long term (current) use of oral hypoglycemic drugs: Secondary | ICD-10-CM | POA: Insufficient documentation

## 2023-12-01 DIAGNOSIS — G8929 Other chronic pain: Secondary | ICD-10-CM

## 2023-12-01 DIAGNOSIS — E119 Type 2 diabetes mellitus without complications: Secondary | ICD-10-CM

## 2023-12-01 DIAGNOSIS — D649 Anemia, unspecified: Secondary | ICD-10-CM | POA: Diagnosis not present

## 2023-12-01 DIAGNOSIS — Z1211 Encounter for screening for malignant neoplasm of colon: Secondary | ICD-10-CM | POA: Insufficient documentation

## 2023-12-01 DIAGNOSIS — E1121 Type 2 diabetes mellitus with diabetic nephropathy: Secondary | ICD-10-CM | POA: Diagnosis not present

## 2023-12-01 DIAGNOSIS — Z794 Long term (current) use of insulin: Secondary | ICD-10-CM | POA: Insufficient documentation

## 2023-12-01 DIAGNOSIS — E1142 Type 2 diabetes mellitus with diabetic polyneuropathy: Secondary | ICD-10-CM

## 2023-12-01 DIAGNOSIS — M2550 Pain in unspecified joint: Secondary | ICD-10-CM

## 2023-12-01 LAB — POCT GLYCOSYLATED HEMOGLOBIN (HGB A1C): Hemoglobin A1C: 9.4 % — AB (ref 4.0–5.6)

## 2023-12-01 MED ORDER — METFORMIN HCL 1000 MG PO TABS
1000.0000 mg | ORAL_TABLET | Freq: Two times a day (BID) | ORAL | 1 refills | Status: DC
  Filled 2023-12-01: qty 180, 90d supply, fill #0
  Filled 2024-02-27: qty 180, 90d supply, fill #1

## 2023-12-01 MED ORDER — FLUTICASONE FUROATE-VILANTEROL 200-25 MCG/ACT IN AEPB
1.0000 | INHALATION_SPRAY | Freq: Every day | RESPIRATORY_TRACT | 0 refills | Status: DC
  Filled 2023-12-01: qty 180, 180d supply, fill #0
  Filled 2023-12-01: qty 60, 30d supply, fill #0

## 2023-12-01 MED ORDER — TRUE METRIX BLOOD GLUCOSE TEST VI STRP
ORAL_STRIP | 3 refills | Status: DC
  Filled 2023-12-01: qty 200, 67d supply, fill #0

## 2023-12-01 MED ORDER — ALBUTEROL SULFATE HFA 108 (90 BASE) MCG/ACT IN AERS
2.0000 | INHALATION_SPRAY | RESPIRATORY_TRACT | 3 refills | Status: DC | PRN
  Filled 2023-12-01: qty 6.7, 17d supply, fill #0

## 2023-12-01 MED ORDER — LANTUS SOLOSTAR 100 UNIT/ML ~~LOC~~ SOPN
10.0000 [IU] | PEN_INJECTOR | Freq: Every day | SUBCUTANEOUS | 99 refills | Status: AC
  Filled 2023-12-01: qty 9, 90d supply, fill #0
  Filled 2024-05-15: qty 3, 28d supply, fill #1

## 2023-12-01 MED ORDER — TRUEPLUS LANCETS 28G MISC
3 refills | Status: DC
Start: 2023-12-01 — End: 2024-03-02
  Filled 2023-12-01: qty 200, 66d supply, fill #0

## 2023-12-01 MED ORDER — MELOXICAM 7.5 MG PO TABS
7.5000 mg | ORAL_TABLET | Freq: Every day | ORAL | 0 refills | Status: DC
Start: 2023-12-01 — End: 2024-05-15
  Filled 2023-12-01 – 2024-02-27 (×2): qty 30, 30d supply, fill #0

## 2023-12-01 MED ORDER — ATORVASTATIN CALCIUM 40 MG PO TABS
40.0000 mg | ORAL_TABLET | Freq: Every day | ORAL | 3 refills | Status: DC
  Filled 2023-12-01: qty 90, 90d supply, fill #0

## 2023-12-01 MED ORDER — GABAPENTIN 100 MG PO CAPS
100.0000 mg | ORAL_CAPSULE | Freq: Three times a day (TID) | ORAL | 3 refills | Status: DC
Start: 2023-12-01 — End: 2024-03-02
  Filled 2023-12-01: qty 90, 30d supply, fill #0

## 2023-12-01 NOTE — Patient Instructions (Addendum)
 Check blood sugar first thing in the morning and then 1-2 hours after your biggest meal Your blood sugars fasting should be 90-130 and after you eat should be less than 180

## 2023-12-01 NOTE — Progress Notes (Signed)
 Assessment & Plan:  Mark Curtis was seen today for medical management of chronic issues.  Diagnoses and all orders for this visit:  Type 2 diabetes mellitus with insulin therapy  Initiate insulin and continue all other medications as prescribed -     POCT glycosylated hemoglobin (Hb A1C) -     metFORMIN (GLUCOPHAGE) 1000 MG tablet; Take 1 tablet (1,000 mg total) by mouth 2 (two) times daily with a meal. FOR DIABETES -     Ambulatory referral to Ophthalmology -     insulin glargine (LANTUS SOLOSTAR) 100 UNIT/ML Solostar Pen; Inject 10 Units into the skin at bedtime. -     CMP14+EGFR -     glucose blood (TRUE METRIX BLOOD GLUCOSE TEST) test strip; Use as instructed. Check blood glucose level by fingerstick three times per day. -     TRUEplus Lancets 28G MISC; Use as instructed. Check blood glucose level by fingerstick three times per day.  Hyperlipidemia associated with type 2 diabetes mellitus (HCC) -     atorvastatin (LIPITOR) 40 MG tablet; Take 1 tablet (40 mg total) by mouth daily at 6 PM. FOR CHOLESTEROL -     Urine Albumin/Creatinine with ratio (send out) [LAB689] -     Lipid panel INSTRUCTIONS: Work on a low fat, heart healthy diet and participate in regular aerobic exercise program by working out at least 150 minutes per week; 5 days a week-30 minutes per day. Avoid red meat/beef/steak,  fried foods. junk foods, sodas, sugary drinks, unhealthy snacking, alcohol and smoking.  Drink at least 80 oz of water per day and monitor your carbohydrate intake daily.    Diabetic polyneuropathy associated with type 2 diabetes mellitus Restart gabapentin -     gabapentin (NEURONTIN) 100 MG capsule; Take 1 capsule (100 mg total) by mouth 3 (three) times daily. FOR BURNING in FEET  Chronic joint pain -     meloxicam (MOBIC) 7.5 MG tablet; Take 1 tablet (7.5 mg total) by mouth daily. FOR joint pAIN  Colon cancer screening -     Fecal occult blood, imunochemical([facility])  Chronic  obstructive pulmonary disease with acute exacerbation (HCC) -     fluticasone furoate-vilanterol (BREO ELLIPTA) 200-25 MCG/ACT AEPB; Inhale 1 puff into the lungs daily. NEEDS PASS -     albuterol (VENTOLIN HFA) 108 (90 Base) MCG/ACT inhaler; Inhale 2 puffs into the lungs every 4 (four) hours as needed for wheezing or shortness of breath.  Prostate cancer screening -     PSA  Increased urinary frequency -     PSA  Vitamin D deficiency disease -     VITAMIN D 25 Hydroxy (Vit-D Deficiency, Fractures)  Anemia, unspecified type -     CBC with Differential  Need for vaccination with 20-polyvalent pneumococcal conjugate vaccine -     Pneumococcal conjugate vaccine 20-valent    Patient has been counseled on age-appropriate routine health concerns for screening and prevention. These are reviewed and up-to-date. Referrals have been placed accordingly. Immunizations are up-to-date or declined.    Subjective:   Chief Complaint  Patient presents with   Medical Management of Chronic Issues    Mark Curtis 68 y.o. male presents to office today for follow-up to diabetes.  He is accompanied by his son who is translating for him today.  Mark Curtis will be traveling to [place] soon and will be medication prescriptions extended while he is in daycare.   He has a past medical history of Chronic headaches, COPD current smoker,  DM 2, and syncope    DM 2 He endorses burning in both feet.  A1c is not at goal and diabetes is poorly controlled.  We have had him on gabapentin in the past however he does not recall if this was effective or not.  We will restart today for neuropathy.  He reports home blood glucose readings in the 200s.  He is not dietary adherent.  Currently prescribed metformin 1000 mg twice daily and Trulicity 0.75 mg weekly. Lab Results  Component Value Date   HGBA1C 9.4 (A) 12/01/2023    Lab Results  Component Value Date   HGBA1C 9.3 (A) 09/15/2023  LDL not at goal with high  intensity statin.   Lab Results  Component Value Date   LDLCALC 155 (H) 04/17/2022    Blood pressure is well-controlled. BP Readings from Last 3 Encounters:  12/01/23 125/85  09/15/23 131/79  02/20/23 128/80     Review of Systems  Constitutional:  Negative for fever, malaise/fatigue and weight loss.  HENT: Negative.  Negative for nosebleeds.   Eyes: Negative.  Negative for blurred vision, double vision and photophobia.  Respiratory: Negative.  Negative for cough and shortness of breath.   Cardiovascular: Negative.  Negative for chest pain, palpitations and leg swelling.  Gastrointestinal: Negative.  Negative for heartburn, nausea and vomiting.  Genitourinary:  Positive for frequency.  Musculoskeletal:  Positive for joint pain and neck pain. Negative for back pain and myalgias.  Neurological:  Positive for tingling and sensory change. Negative for dizziness, focal weakness, seizures and headaches.  Psychiatric/Behavioral: Negative.  Negative for suicidal ideas.     Past Medical History:  Diagnosis Date   Chronic headaches    COPD (chronic obstructive pulmonary disease) (HCC)    DM (diabetes mellitus) (HCC)    TYPE 2   Spell of dizziness    WHEN STANDING    History reviewed. No pertinent surgical history.  Family History  Problem Relation Age of Onset   Diabetes Father     Social History Reviewed with no changes to be made today.   Outpatient Medications Prior to Visit  Medication Sig Dispense Refill   Dulaglutide (TRULICITY) 0.75 MG/0.5ML SOAJ Inject 0.75 mg into the skin once a week. 2 mL 1   ipratropium-albuterol (DUONEB) 0.5-2.5 (3) MG/3ML SOLN Take 3 mLs by nebulization every 6 (six) hours as needed. 360 mL 6   albuterol (VENTOLIN HFA) 108 (90 Base) MCG/ACT inhaler Inhale 2 puffs into the lungs every 4 (four) hours as needed for wheezing or shortness of breath. NEEDS PASS 18 g 3   atorvastatin (LIPITOR) 40 MG tablet Take 1 tablet (40 mg total) by mouth daily at 6  PM. FOR CHOLESTEROL 90 tablet 0   fluticasone furoate-vilanterol (BREO ELLIPTA) 200-25 MCG/ACT AEPB Inhale 1 puff into the lungs daily. NEEDS PASS 60 each 11   glucose blood (TRUE METRIX BLOOD GLUCOSE TEST) test strip Use 1 each to check blood glucose once daily 100 each 3   meloxicam (MOBIC) 7.5 MG tablet Take 1 tablet (7.5 mg total) by mouth daily. FOR joint pAIN 30 tablet 0   metFORMIN (GLUCOPHAGE) 1000 MG tablet Take 1 tablet (1,000 mg total) by mouth 2 (two) times daily with a meal. 180 tablet 0   tiZANidine (ZANAFLEX) 4 MG tablet Take 1 tablet (4 mg total) by mouth every 6 (six) hours as needed for muscle spasms. 30 tablet 0   TRUEplus Lancets 28G MISC Use 1 each to check blood glucose once daily 100  each 3   No facility-administered medications prior to visit.    Allergies  Allergen Reactions   Lisinopril Other (See Comments)    Unset stomach       Objective:    BP 125/85 (BP Location: Left Arm, Patient Position: Sitting, Cuff Size: Normal)   Pulse 85   Resp 20   Ht 5\' 11"  (1.803 m)   Wt 190 lb (86.2 kg)   SpO2 98%   BMI 26.50 kg/m  Wt Readings from Last 3 Encounters:  12/01/23 190 lb (86.2 kg)  09/15/23 188 lb (85.3 kg)  02/20/23 192 lb (87.1 kg)    Physical Exam Vitals and nursing note reviewed.  Constitutional:      Appearance: He is well-developed.  HENT:     Head: Normocephalic and atraumatic.  Cardiovascular:     Rate and Rhythm: Normal rate and regular rhythm.     Heart sounds: Normal heart sounds. No murmur heard.    No friction rub. No gallop.  Pulmonary:     Effort: Pulmonary effort is normal. No tachypnea or respiratory distress.     Breath sounds: Normal breath sounds. No decreased breath sounds, wheezing, rhonchi or rales.  Chest:     Chest wall: No tenderness.  Abdominal:     General: Bowel sounds are normal.     Palpations: Abdomen is soft.  Musculoskeletal:        General: Normal range of motion.     Cervical back: Normal range of motion.   Skin:    General: Skin is warm and dry.  Neurological:     Mental Status: He is alert and oriented to person, place, and time.     Coordination: Coordination normal.  Psychiatric:        Behavior: Behavior normal. Behavior is cooperative.        Thought Content: Thought content normal.        Judgment: Judgment normal.          Patient has been counseled extensively about nutrition and exercise as well as the importance of adherence with medications and regular follow-up. The patient was given clear instructions to go to ER or return to medical center if symptoms don't improve, worsen or new problems develop. The patient verbalized understanding.   Follow-up: Return in about 3 months (around 03/02/2024).   Claiborne Rigg, FNP-BC [facility] and [facility] [place], [place] [phone]   12/01/2023, 1:50 PM

## 2023-12-02 ENCOUNTER — Other Ambulatory Visit: Payer: Self-pay

## 2023-12-03 LAB — LIPID PANEL
Chol/HDL Ratio: 6.6 ratio — ABNORMAL HIGH (ref 0.0–5.0)
Cholesterol, Total: 218 mg/dL — ABNORMAL HIGH (ref 100–199)
HDL: 33 mg/dL — ABNORMAL LOW (ref >39)
LDL Chol Calc (NIH): 142 mg/dL — ABNORMAL HIGH (ref 0–99)
Triglycerides: 236 mg/dL — ABNORMAL HIGH (ref 0–149)
VLDL Cholesterol Cal: 43 mg/dL — ABNORMAL HIGH (ref 5–40)

## 2023-12-03 LAB — CMP14+EGFR
ALT: 43 IU/L (ref 0–44)
AST: 16 IU/L (ref 0–40)
Albumin: 4.6 g/dL (ref 3.9–4.9)
Alkaline Phosphatase: 139 IU/L — ABNORMAL HIGH (ref 44–121)
BUN/Creatinine Ratio: 11 (ref 10–24)
BUN: 11 mg/dL (ref 8–27)
Bilirubin Total: 0.7 mg/dL (ref 0.0–1.2)
CO2: 24 mmol/L (ref 20–29)
Calcium: 10.2 mg/dL (ref 8.6–10.2)
Chloride: 95 mmol/L — ABNORMAL LOW (ref 96–106)
Creatinine, Ser: 0.97 mg/dL (ref 0.76–1.27)
Globulin, Total: 3 g/dL (ref 1.5–4.5)
Glucose: 178 mg/dL — ABNORMAL HIGH (ref 70–99)
Potassium: 5 mmol/L (ref 3.5–5.2)
Sodium: 138 mmol/L (ref 134–144)
Total Protein: 7.6 g/dL (ref 6.0–8.5)
eGFR: 86 mL/min/{1.73_m2} (ref >59)

## 2023-12-03 LAB — CBC WITH DIFFERENTIAL/PLATELET
Basophils Absolute: 0.1 10*3/uL (ref 0.0–0.2)
Basos: 1 %
EOS (ABSOLUTE): 0.3 10*3/uL (ref 0.0–0.4)
Eos: 3 %
Hematocrit: 41 % (ref 37.5–51.0)
Hemoglobin: 13.7 g/dL (ref 13.0–17.7)
Immature Grans (Abs): 0 10*3/uL (ref 0.0–0.1)
Immature Granulocytes: 0 %
Lymphocytes Absolute: 3.9 10*3/uL — ABNORMAL HIGH (ref 0.7–3.1)
Lymphs: 40 %
MCH: 31.6 pg (ref 26.6–33.0)
MCHC: 33.4 g/dL (ref 31.5–35.7)
MCV: 95 fL (ref 79–97)
Monocytes Absolute: 0.7 10*3/uL (ref 0.1–0.9)
Monocytes: 7 %
Neutrophils Absolute: 4.8 10*3/uL (ref 1.4–7.0)
Neutrophils: 49 %
Platelets: 286 10*3/uL (ref 150–450)
RBC: 4.34 x10E6/uL (ref 4.14–5.80)
RDW: 11.4 % — ABNORMAL LOW (ref 11.6–15.4)
WBC: 9.8 10*3/uL (ref 3.4–10.8)

## 2023-12-03 LAB — MICROALBUMIN / CREATININE URINE RATIO
Creatinine, Urine: 122.9 mg/dL
Microalb/Creat Ratio: 37 mg/g{creat} — ABNORMAL HIGH (ref 0–29)
Microalbumin, Urine: 45.3 ug/mL

## 2023-12-03 LAB — VITAMIN D 25 HYDROXY (VIT D DEFICIENCY, FRACTURES): Vit D, 25-Hydroxy: 32.1 ng/mL (ref 30.0–100.0)

## 2023-12-03 LAB — PSA: Prostate Specific Ag, Serum: 0.7 ng/mL (ref 0.0–4.0)

## 2023-12-06 ENCOUNTER — Encounter: Payer: Self-pay | Admitting: Nurse Practitioner

## 2023-12-10 ENCOUNTER — Other Ambulatory Visit: Payer: Self-pay

## 2024-02-27 ENCOUNTER — Other Ambulatory Visit: Payer: Self-pay

## 2024-03-01 ENCOUNTER — Other Ambulatory Visit: Payer: Self-pay

## 2024-03-02 ENCOUNTER — Encounter: Payer: Self-pay | Admitting: Nurse Practitioner

## 2024-03-02 ENCOUNTER — Other Ambulatory Visit: Payer: Self-pay

## 2024-03-02 ENCOUNTER — Ambulatory Visit: Attending: Nurse Practitioner | Admitting: Nurse Practitioner

## 2024-03-02 VITALS — BP 134/79 | HR 69 | Resp 20 | Ht 71.0 in | Wt 187.0 lb

## 2024-03-02 DIAGNOSIS — Z7984 Long term (current) use of oral hypoglycemic drugs: Secondary | ICD-10-CM | POA: Insufficient documentation

## 2024-03-02 DIAGNOSIS — J441 Chronic obstructive pulmonary disease with (acute) exacerbation: Secondary | ICD-10-CM | POA: Insufficient documentation

## 2024-03-02 DIAGNOSIS — E1159 Type 2 diabetes mellitus with other circulatory complications: Secondary | ICD-10-CM | POA: Insufficient documentation

## 2024-03-02 DIAGNOSIS — Z7985 Long-term (current) use of injectable non-insulin antidiabetic drugs: Secondary | ICD-10-CM | POA: Insufficient documentation

## 2024-03-02 DIAGNOSIS — E1142 Type 2 diabetes mellitus with diabetic polyneuropathy: Secondary | ICD-10-CM

## 2024-03-02 DIAGNOSIS — Z79899 Other long term (current) drug therapy: Secondary | ICD-10-CM | POA: Insufficient documentation

## 2024-03-02 DIAGNOSIS — Z794 Long term (current) use of insulin: Secondary | ICD-10-CM | POA: Insufficient documentation

## 2024-03-02 DIAGNOSIS — E119 Type 2 diabetes mellitus without complications: Secondary | ICD-10-CM

## 2024-03-02 DIAGNOSIS — E1169 Type 2 diabetes mellitus with other specified complication: Secondary | ICD-10-CM | POA: Diagnosis present

## 2024-03-02 DIAGNOSIS — Z1211 Encounter for screening for malignant neoplasm of colon: Secondary | ICD-10-CM | POA: Insufficient documentation

## 2024-03-02 DIAGNOSIS — E785 Hyperlipidemia, unspecified: Secondary | ICD-10-CM | POA: Insufficient documentation

## 2024-03-02 DIAGNOSIS — I152 Hypertension secondary to endocrine disorders: Secondary | ICD-10-CM | POA: Insufficient documentation

## 2024-03-02 LAB — POCT GLYCOSYLATED HEMOGLOBIN (HGB A1C): Hemoglobin A1C: 8.5 % — AB (ref 4.0–5.6)

## 2024-03-02 MED ORDER — TRUE METRIX BLOOD GLUCOSE TEST VI STRP
ORAL_STRIP | 3 refills | Status: AC
  Filled 2024-03-02: qty 100, 33d supply, fill #0
  Filled 2024-05-15 – 2024-05-21 (×2): qty 100, 33d supply, fill #1
  Filled 2024-07-14: qty 100, 33d supply, fill #2

## 2024-03-02 MED ORDER — TRUE METRIX METER W/DEVICE KIT
PACK | 0 refills | Status: AC
  Filled 2024-03-02: qty 1, 30d supply, fill #0

## 2024-03-02 MED ORDER — TRUEPLUS LANCETS 28G MISC
3 refills | Status: AC
  Filled 2024-03-02: qty 100, 33d supply, fill #0
  Filled 2024-05-15: qty 100, 33d supply, fill #1

## 2024-03-02 MED ORDER — ALBUTEROL SULFATE HFA 108 (90 BASE) MCG/ACT IN AERS
2.0000 | INHALATION_SPRAY | RESPIRATORY_TRACT | 3 refills | Status: AC | PRN
  Filled 2024-03-02: qty 6.7, 17d supply, fill #0
  Filled 2024-05-15: qty 6.7, 17d supply, fill #1

## 2024-03-02 MED ORDER — GABAPENTIN 100 MG PO CAPS
100.0000 mg | ORAL_CAPSULE | Freq: Three times a day (TID) | ORAL | 3 refills | Status: AC
  Filled 2024-03-02: qty 90, 30d supply, fill #0
  Filled 2024-05-15: qty 90, 30d supply, fill #1
  Filled 2024-07-14: qty 90, 30d supply, fill #2

## 2024-03-02 MED ORDER — METFORMIN HCL 1000 MG PO TABS
1000.0000 mg | ORAL_TABLET | Freq: Two times a day (BID) | ORAL | 1 refills | Status: AC
  Filled 2024-03-02 – 2024-05-15 (×2): qty 180, 90d supply, fill #0
  Filled 2024-07-14: qty 180, 90d supply, fill #1

## 2024-03-02 MED ORDER — FLUTICASONE FUROATE-VILANTEROL 200-25 MCG/ACT IN AEPB
1.0000 | INHALATION_SPRAY | Freq: Every day | RESPIRATORY_TRACT | 6 refills | Status: AC
Start: 2024-03-02 — End: ?
  Filled 2024-03-02: qty 180, 180d supply, fill #0
  Filled 2024-03-02: qty 60, 30d supply, fill #0

## 2024-03-02 MED ORDER — TRULICITY 1.5 MG/0.5ML ~~LOC~~ SOAJ
1.5000 mg | SUBCUTANEOUS | 3 refills | Status: AC
  Filled 2024-03-02: qty 2, 28d supply, fill #0
  Filled 2024-05-15: qty 2, 28d supply, fill #1
  Filled 2024-06-21 – 2024-07-14 (×2): qty 2, 28d supply, fill #2

## 2024-03-02 MED ORDER — ATORVASTATIN CALCIUM 40 MG PO TABS
40.0000 mg | ORAL_TABLET | Freq: Every day | ORAL | 3 refills | Status: AC
  Filled 2024-03-02: qty 90, 90d supply, fill #0

## 2024-03-02 NOTE — Progress Notes (Signed)
 Assessment & Plan:   [name] was seen today for diabetes.  Diagnoses and all orders for this visit:  Type 2 diabetes mellitus with insulin  therapy (HCC) -     POCT glycosylated hemoglobin (Hb A1C) -     Ambulatory referral to Ophthalmology -     Dulaglutide  (TRULICITY ) 1.5 MG/0.5ML SOAJ; Inject 1.5 mg into the skin once a week. -     metFORMIN  (GLUCOPHAGE ) 1000 MG tablet; Take 1 tablet (1,000 mg total) by mouth 2 (two) times daily with a meal. FOR DIABETES -     Blood Glucose Monitoring Suppl (TRUE METRIX METER) w/Device KIT; Use as instructed. Check blood glucose level by fingerstick twice per day. E11.65 -     glucose blood (TRUE METRIX BLOOD GLUCOSE TEST) test strip; Use as instructed. Check blood glucose level by fingerstick three times per day. -     TRUEplus Lancets 28G MISC; Use as instructed. Check blood glucose level by fingerstick three times per day. -     CMP14+EGFR  Hyperlipidemia associated with type 2 diabetes mellitus (HCC) -     atorvastatin  (LIPITOR) 40 MG tablet; Take 1 tablet (40 mg total) by mouth daily at 6 PM. FOR CHOLESTEROL -     Lipid panel  Diabetic polyneuropathy associated with type 2 diabetes mellitus (HCC) -     gabapentin  (NEURONTIN ) 100 MG capsule; Take 1 capsule (100 mg total) by mouth 3 (three) times daily. FOR BURNING in FEET  Chronic obstructive pulmonary disease with acute exacerbation  Symptoms stable at this time -     fluticasone  furoate-vilanterol (BREO ELLIPTA ) 200-25 MCG/ACT AEPB; Inhale 1 puff into the lungs daily. -     albuterol  (VENTOLIN  HFA) 108 (90 Base) MCG/ACT inhaler; Inhale 2 puffs into the lungs every 4 (four) hours as needed for wheezing or shortness of breath.  Colon cancer screening -     Ambulatory referral to Gastroenterology    Patient has been counseled on age-appropriate routine health concerns for screening and prevention. These are reviewed and up-to-date. Referrals have been placed accordingly. Immunizations are  up-to-date or declined.    Subjective:   Chief Complaint  Patient presents with   Diabetes    Mark Curtis 68 y.o. male presents to office today for DM, COPD, HPL  He has his son here with him today interpreting for him  He has a past medical history of Chronic headaches, COPD, DM, and Spell of dizziness.    LDL not at goal. He never picked up the atorvastatin  40 mg that was prescribed The 10-year ASCVD risk score (Arnett DK, et al., 2019) is: 35.8%   Values used to calculate the score:     Age: 13 years     Sex: Male     Is Non-Hispanic African American: No     Diabetic: Yes     Tobacco smoker: No     Systolic Blood Pressure: 134 mmHg     Is BP treated: No     HDL Cholesterol: 33 mg/dL     Total Cholesterol: 218 mg/dL    V7Q down from 9.4 to 8.5. he ran out of Trulicity  and did not request refills. States he thought he finished the medication. He is taking metformin  1000 mg BID as prescribed and lantus  10 units daily.  Lab Results  Component Value Date   HGBA1C 8.5 (A) 03/02/2024     Review of Systems  Constitutional:  Negative for fever, malaise/fatigue and weight loss.  HENT: Negative.  Negative for nosebleeds.   Eyes: Negative.  Negative for blurred vision, double vision and photophobia.  Respiratory: Negative.  Negative for cough and shortness of breath.   Cardiovascular: Negative.  Negative for chest pain, palpitations and leg swelling.  Gastrointestinal: Negative.  Negative for heartburn, nausea and vomiting.  Musculoskeletal: Negative.  Negative for myalgias.  Neurological: Negative.  Negative for dizziness, focal weakness, seizures and headaches.  Psychiatric/Behavioral: Negative.  Negative for suicidal ideas.     Past Medical History:  Diagnosis Date   Chronic headaches    COPD (chronic obstructive pulmonary disease) (HCC)    DM (diabetes mellitus) (HCC)    TYPE 2   Spell of dizziness    WHEN STANDING    No past surgical history on file.  Family  History  Problem Relation Age of Onset   Diabetes Father     Social History Reviewed with no changes to be made today.   Outpatient Medications Prior to Visit  Medication Sig Dispense Refill   insulin  glargine (LANTUS  SOLOSTAR) 100 UNIT/ML Solostar Pen Inject 10 Units into the skin at bedtime. 15 mL PRN   ipratropium-albuterol  (DUONEB) 0.5-2.5 (3) MG/3ML SOLN Take 3 mLs by nebulization every 6 (six) hours as needed. 360 mL 6   meloxicam  (MOBIC ) 7.5 MG tablet Take 1 tablet (7.5 mg total) by mouth daily for joint pain 30 tablet 0   albuterol  (VENTOLIN  HFA) 108 (90 Base) MCG/ACT inhaler Inhale 2 puffs into the lungs every 4 (four) hours as needed for wheezing or shortness of breath. 6.7 g 3   Dulaglutide  (TRULICITY ) 0.75 MG/0.5ML SOAJ Inject 0.75 mg into the skin once a week. 2 mL 1   fluticasone  furoate-vilanterol (BREO ELLIPTA ) 200-25 MCG/ACT AEPB Inhale 1 puff into the lungs daily. NEEDS PASS 180 each 0   gabapentin  (NEURONTIN ) 100 MG capsule Take 1 capsule (100 mg total) by mouth 3 (three) times daily. FOR BURNING in FEET 90 capsule 3   glucose blood (TRUE METRIX BLOOD GLUCOSE TEST) test strip Use as instructed. Check blood glucose level by fingerstick three times per day. 200 each 3   metFORMIN  (GLUCOPHAGE ) 1000 MG tablet Take 1 tablet (1,000 mg total) by mouth 2 (two) times daily with a meal. FOR DIABETES 180 tablet 1   TRUEplus Lancets 28G MISC Use as instructed. Check blood glucose level by fingerstick three times per day. 200 each 3   atorvastatin  (LIPITOR) 40 MG tablet Take 1 tablet (40 mg total) by mouth daily at 6 PM. FOR CHOLESTEROL (Patient not taking: Reported on 03/02/2024) 90 tablet 3   No facility-administered medications prior to visit.    Allergies  Allergen Reactions   Lisinopril  Other (See Comments)    Unset stomach       Objective:    BP 134/79 (BP Location: Left Arm, Patient Position: Sitting, Cuff Size: Normal)   Pulse 69   Resp 20   Ht 5\' 11"  (1.803 m)   Wt  187 lb (84.8 kg)   SpO2 98%   BMI 26.08 kg/m  Wt Readings from Last 3 Encounters:  03/02/24 187 lb (84.8 kg)  12/01/23 190 lb (86.2 kg)  09/15/23 188 lb (85.3 kg)    Physical Exam Vitals and nursing note reviewed.  Constitutional:      Appearance: He is well-developed.  HENT:     Head: Normocephalic and atraumatic.  Cardiovascular:     Rate and Rhythm: Normal rate and regular rhythm.     Heart sounds: Normal heart sounds. No murmur  heard.    No friction rub. No gallop.  Pulmonary:     Effort: Pulmonary effort is normal. No tachypnea or respiratory distress.     Breath sounds: Normal breath sounds. No decreased breath sounds, wheezing, rhonchi or rales.  Chest:     Chest wall: No tenderness.  Abdominal:     General: Bowel sounds are normal.     Palpations: Abdomen is soft.  Musculoskeletal:        General: Normal range of motion.     Cervical back: Normal range of motion.  Skin:    General: Skin is warm and dry.  Neurological:     Mental Status: He is alert and oriented to person, place, and time.     Coordination: Coordination normal.  Psychiatric:        Behavior: Behavior normal. Behavior is cooperative.        Thought Content: Thought content normal.        Judgment: Judgment normal.          Patient has been counseled extensively about nutrition and exercise as well as the importance of adherence with medications and regular follow-up. The patient was given clear instructions to go to ER or return to medical center if symptoms don't improve, worsen or new problems develop. The patient verbalized understanding.   Follow-up: Return in about 3 months (around 06/02/2024).   Collins Dean, FNP-BC [facility] [place], [place] [phone]   03/02/2024, 8:09 PM

## 2024-03-02 NOTE — Progress Notes (Signed)
 Needs new meter

## 2024-03-05 ENCOUNTER — Other Ambulatory Visit: Payer: Self-pay

## 2024-04-23 ENCOUNTER — Encounter: Payer: Self-pay | Admitting: Nurse Practitioner

## 2024-05-15 ENCOUNTER — Other Ambulatory Visit: Payer: Self-pay

## 2024-05-15 ENCOUNTER — Other Ambulatory Visit: Payer: Self-pay | Admitting: Nurse Practitioner

## 2024-05-15 DIAGNOSIS — G8929 Other chronic pain: Secondary | ICD-10-CM

## 2024-05-17 ENCOUNTER — Other Ambulatory Visit: Payer: Self-pay

## 2024-05-17 MED ORDER — MELOXICAM 7.5 MG PO TABS
7.5000 mg | ORAL_TABLET | Freq: Every day | ORAL | 0 refills | Status: AC
  Filled 2024-05-17: qty 90, 90d supply, fill #0

## 2024-05-17 NOTE — Telephone Encounter (Signed)
 Requested Prescriptions  Pending Prescriptions Disp Refills   meloxicam  (MOBIC ) 7.5 MG tablet 90 tablet 0    Sig: Take 1 tablet (7.5 mg total) by mouth daily for joint pain     Analgesics:  COX2 Inhibitors Failed - 05/17/2024  3:32 PM      Failed - Manual Review: Labs are only required if the patient has taken medication for more than 8 weeks.      Passed - HGB in normal range and within 360 days    Hemoglobin  Date Value Ref Range Status  12/01/2023 13.7 13.0 - 17.7 g/dL Final         Passed - Cr in normal range and within 360 days    Creatinine, Ser  Date Value Ref Range Status  12/01/2023 0.97 0.76 - 1.27 mg/dL Final         Passed - HCT in normal range and within 360 days    Hematocrit  Date Value Ref Range Status  12/01/2023 41.0 37.5 - 51.0 % Final         Passed - AST in normal range and within 360 days    AST  Date Value Ref Range Status  12/01/2023 16 0 - 40 IU/L Final         Passed - ALT in normal range and within 360 days    ALT  Date Value Ref Range Status  12/01/2023 43 0 - 44 IU/L Final         Passed - eGFR is 30 or above and within 360 days    GFR calc Af Amer  Date Value Ref Range Status  06/19/2018 115 >59 mL/min/1.73 Final   GFR, Estimated  Date Value Ref Range Status  09/02/2021 >60 >60 mL/min Final    Comment:    (NOTE) Calculated using the CKD-EPI Creatinine Equation (2021)    eGFR  Date Value Ref Range Status  12/01/2023 86 >59 mL/min/1.73 Final         Passed - Patient is not pregnant      Passed - Valid encounter within last 12 months    Recent Outpatient Visits           2 months ago Type 2 diabetes mellitus with insulin  therapy (HCC)   [facility] [place]. [facility] [place], [place] W, NP   5 months ago Type 2 diabetes mellitus with insulin  therapy [facility])   [facility] [place]. [facility] [place], [place] W, NP   8 months ago Type 2 diabetes  mellitus with other specified complication, without long-term current use of insulin  [facility])   [facility] [place]. [facility] NOVAK, MD   1 year ago Neck pain   [facility] Of [place]. [facility] [place], [place] W, NP   1 year ago Type 2 diabetes mellitus with hyperglycemia, without long-term current use of insulin  [facility])   [facility] Of [place]. [facility] Barnie NOVAK, MD       Future Appointments             In 2 weeks Theotis Haze ORN, NP [facility] Of [place]. [facility]

## 2024-05-21 ENCOUNTER — Other Ambulatory Visit: Payer: Self-pay

## 2024-06-01 ENCOUNTER — Telehealth: Payer: Self-pay | Admitting: Nurse Practitioner

## 2024-06-01 NOTE — Telephone Encounter (Signed)
 Pt unconfirmed appt 9/10 nolvm mailbox full

## 2024-06-02 ENCOUNTER — Encounter: Payer: Self-pay | Admitting: Nurse Practitioner

## 2024-06-02 ENCOUNTER — Ambulatory Visit: Attending: Nurse Practitioner | Admitting: Nurse Practitioner

## 2024-06-02 VITALS — BP 130/83 | HR 76 | Resp 19 | Ht 71.0 in | Wt 190.2 lb

## 2024-06-02 DIAGNOSIS — H9042 Sensorineural hearing loss, unilateral, left ear, with unrestricted hearing on the contralateral side: Secondary | ICD-10-CM | POA: Diagnosis not present

## 2024-06-02 DIAGNOSIS — E7800 Pure hypercholesterolemia, unspecified: Secondary | ICD-10-CM | POA: Diagnosis not present

## 2024-06-02 DIAGNOSIS — Z555 Less than a high school diploma: Secondary | ICD-10-CM | POA: Insufficient documentation

## 2024-06-02 DIAGNOSIS — Z6826 Body mass index (BMI) 26.0-26.9, adult: Secondary | ICD-10-CM | POA: Diagnosis not present

## 2024-06-02 DIAGNOSIS — Z7182 Exercise counseling: Secondary | ICD-10-CM | POA: Diagnosis not present

## 2024-06-02 DIAGNOSIS — Z713 Dietary counseling and surveillance: Secondary | ICD-10-CM | POA: Insufficient documentation

## 2024-06-02 DIAGNOSIS — Z1211 Encounter for screening for malignant neoplasm of colon: Secondary | ICD-10-CM | POA: Diagnosis not present

## 2024-06-02 DIAGNOSIS — Z794 Long term (current) use of insulin: Secondary | ICD-10-CM | POA: Insufficient documentation

## 2024-06-02 DIAGNOSIS — Z7985 Long-term (current) use of injectable non-insulin antidiabetic drugs: Secondary | ICD-10-CM | POA: Diagnosis not present

## 2024-06-02 DIAGNOSIS — E119 Type 2 diabetes mellitus without complications: Secondary | ICD-10-CM | POA: Diagnosis not present

## 2024-06-02 LAB — POCT GLYCOSYLATED HEMOGLOBIN (HGB A1C): Hemoglobin A1C: 7.9 % — AB (ref 4.0–5.6)

## 2024-06-02 NOTE — Progress Notes (Unsigned)
 Assessment & Plan:  Mark Curtis was seen today for diabetes.  Diagnoses and all orders for this visit:  Type 2 diabetes mellitus with insulin  therapy (HCC) -     POCT glycosylated hemoglobin (Hb A1C) -     Ambulatory referral to Ophthalmology -     CMP14+EGFR Type 2 diabetes with A1c of 7.9. Confusion with insulin  and Trulicity  use affecting glycemic control. - Educated on correct use of insulin  and Trulicity  pens, emphasizing daily insulin  and weekly Trulicity . - Ensure possession of two pens: one for daily insulin , one for weekly Trulicity . - Check liver and kidney function. - Check cholesterol levels.  Sensorineural hearing loss (SNHL) of left ear with unrestricted hearing of right ear -     Ambulatory referral to Audiology Left-sided sensorineural hearing loss Confirmed left-sided sensorineural hearing loss. Requires further evaluation for potential interventions. - Refer to hearing specialist for evaluation and hearing screen.   Hypercholesterolemia -     Lipid panel Lab Results  Component Value Date   LDLCALC 51 06/02/2024     Colon cancer screening -     Ambulatory referral to Gastroenterology   General Health Maintenance Behind on routine colon cancer screening. - Recommend colonoscopy as overdue for screening.  Patient has been counseled on age-appropriate routine health concerns for screening and prevention. These are reviewed and up-to-date. Referrals have been placed accordingly. Immunizations are up-to-date or declined.    Subjective:   Chief Complaint  Patient presents with   Diabetes   History of Present Illness Mark Curtis is a 68 year old male with diabetes who presents for follow-up on his diabetes management.  He is accompanied by his son today who is interpreting for him today.   DM 2 His last A1c in June was 8.5%, and it has decreased today to 7.9%, with a goal of 7% or less. He is currently using Trulicity  once a week and insulin  daily, although  there is some confusion about the frequency of insulin  use. He uses only one pen, which is for the weekly injection, and has been inconsistent with the daily insulin  injections. Only using his long acting insulin  as needed Lab Results  Component Value Date   HGBA1C 7.9 (A) 06/02/2024     He is experiencing hearing loss in his left ear. He has not reported hearing loss in the right ear. No cerumen impaction on exam today.   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   Review of Systems  Constitutional:  Negative for fever, malaise/fatigue and weight loss.  HENT:  Positive for hearing loss. Negative for nosebleeds.   Eyes: Negative.  Negative for blurred vision, double vision and photophobia.  Respiratory: Negative.  Negative for cough and shortness of breath.   Cardiovascular: Negative.  Negative for chest pain, palpitations and leg swelling.  Gastrointestinal: Negative.  Negative for heartburn, nausea and vomiting.  Musculoskeletal: Negative.  Negative for myalgias.  Neurological: Negative.  Negative for dizziness, focal weakness, seizures and headaches.  Psychiatric/Behavioral: Negative.  Negative for suicidal ideas.     Past Medical History:  Diagnosis Date   Chronic headaches    COPD (chronic obstructive pulmonary disease) (HCC)    DM (diabetes mellitus) (HCC)    TYPE 2   Spell of dizziness    WHEN STANDING    No past surgical history on file.  Family  History  Problem Relation Age of Onset   Diabetes Father     Social History Reviewed with no changes to be made today.   Outpatient Medications Prior to Visit  Medication Sig Dispense Refill   albuterol  (VENTOLIN  HFA) 108 (90 Base) MCG/ACT inhaler Inhale 2 puffs into the lungs every 4 (four) hours as needed for wheezing or shortness of breath. 6.7 g 3   atorvastatin  (LIPITOR) 40 MG tablet  Take 1 tablet (40 mg total) by mouth daily at 6 PM. FOR CHOLESTEROL 90 tablet 3   Blood Glucose Monitoring Suppl (TRUE METRIX METER) w/Device KIT Use as instructed. Check blood glucose level by fingerstick twice per day. E11.65 1 kit 0   Dulaglutide  (TRULICITY ) 1.5 MG/0.5ML SOAJ Inject 1.5 mg into the skin once a week. 6 mL 3   fluticasone  furoate-vilanterol (BREO ELLIPTA ) 200-25 MCG/ACT AEPB Inhale 1 puff into the lungs daily. 180 each 6   gabapentin  (NEURONTIN ) 100 MG capsule Take 1 capsule (100 mg total) by mouth 3 (three) times daily. FOR BURNING in FEET 90 capsule 3   glucose blood (TRUE METRIX BLOOD GLUCOSE TEST) test strip Use as instructed. Check blood glucose level by fingerstick three times per day. 200 each 3   insulin  glargine (LANTUS  SOLOSTAR) 100 UNIT/ML Solostar Pen Inject 10 Units into the skin at bedtime. 15 mL PRN   ipratropium-albuterol  (DUONEB) 0.5-2.5 (3) MG/3ML SOLN Take 3 mLs by nebulization every 6 (six) hours as needed. 360 mL 6   meloxicam  (MOBIC ) 7.5 MG tablet Take 1 tablet (7.5 mg total) by mouth daily for joint pain 90 tablet 0   metFORMIN  (GLUCOPHAGE ) 1000 MG tablet Take 1 tablet (1,000 mg total) by mouth 2 (two) times daily with a meal. FOR DIABETES 180 tablet 1   TRUEplus Lancets 28G MISC Use as instructed. Check blood glucose level by fingerstick three times per day. 200 each 3   No facility-administered medications prior to visit.    Allergies  Allergen Reactions   Lisinopril  Other (See Comments)    Unset stomach       Objective:    BP 130/83 (BP Location: Left Arm, Patient Position: Sitting, Cuff Size: Normal)   Pulse 76   Resp 19   Ht 5' 11 (1.803 m)   Wt 190 lb 3.2 oz (86.3 kg)   SpO2 98%   BMI 26.53 kg/m  Wt Readings from Last 3 Encounters:  06/02/24 190 lb 3.2 oz (86.3 kg)  03/02/24 187 lb (84.8 kg)  12/01/23 190 lb (86.2 kg)    Physical Exam Vitals and nursing note reviewed.  Constitutional:      Appearance: He is well-developed.   HENT:     Head: Normocephalic and atraumatic.  Cardiovascular:     Rate and Rhythm: Normal rate and regular rhythm.     Heart sounds: Normal heart sounds. No murmur heard.    No friction rub. No gallop.  Pulmonary:     Effort: Pulmonary effort is normal. No tachypnea or respiratory distress.     Breath sounds: Normal breath sounds. No decreased breath sounds, wheezing, rhonchi or rales.  Chest:     Chest wall: No tenderness.  Musculoskeletal:        General: Normal range of motion.     Cervical back: Normal range of motion.  Skin:    General: Skin is warm and dry.  Neurological:     Mental Status: He is alert and oriented to person, place, and time.     Coordination:  Coordination normal.  Psychiatric:        Behavior: Behavior normal. Behavior is cooperative.        Thought Content: Thought content normal.        Judgment: Judgment normal.          Patient has been counseled extensively about nutrition and exercise as well as the importance of adherence with medications and regular follow-up. The patient was given clear instructions to go to ER or return to medical center if symptoms don't improve, worsen or new problems develop. The patient verbalized understanding.   Follow-up: Return in about 3 months (around 09/09/2024).   Haze LELON Servant, FNP-BC [facility] [place], [place] [phone]   06/03/2024, 8:05 AM

## 2024-06-03 ENCOUNTER — Encounter: Payer: Self-pay | Admitting: Nurse Practitioner

## 2024-06-03 ENCOUNTER — Ambulatory Visit: Payer: Self-pay | Admitting: Nurse Practitioner

## 2024-06-03 LAB — CMP14+EGFR
ALT: 21 IU/L (ref 0–44)
AST: 12 IU/L (ref 0–40)
Albumin: 4.5 g/dL (ref 3.9–4.9)
Alkaline Phosphatase: 137 IU/L — ABNORMAL HIGH (ref 44–121)
BUN/Creatinine Ratio: 11 (ref 10–24)
BUN: 10 mg/dL (ref 8–27)
Bilirubin Total: 0.7 mg/dL (ref 0.0–1.2)
CO2: 23 mmol/L (ref 20–29)
Calcium: 10.5 mg/dL — ABNORMAL HIGH (ref 8.6–10.2)
Chloride: 98 mmol/L (ref 96–106)
Creatinine, Ser: 0.9 mg/dL (ref 0.76–1.27)
Globulin, Total: 3.1 g/dL (ref 1.5–4.5)
Glucose: 132 mg/dL — ABNORMAL HIGH (ref 70–99)
Potassium: 4.8 mmol/L (ref 3.5–5.2)
Sodium: 139 mmol/L (ref 134–144)
Total Protein: 7.6 g/dL (ref 6.0–8.5)
eGFR: 94 mL/min/1.73 (ref >59)

## 2024-06-03 LAB — LIPID PANEL
Chol/HDL Ratio: 3.3 ratio (ref 0.0–5.0)
Cholesterol, Total: 108 mg/dL (ref 100–199)
HDL: 33 mg/dL — ABNORMAL LOW (ref >39)
LDL Chol Calc (NIH): 51 mg/dL (ref 0–99)
Triglycerides: 137 mg/dL (ref 0–149)
VLDL Cholesterol Cal: 24 mg/dL (ref 5–40)

## 2024-06-21 ENCOUNTER — Other Ambulatory Visit: Payer: Self-pay

## 2024-06-25 ENCOUNTER — Other Ambulatory Visit: Payer: Self-pay

## 2024-06-28 ENCOUNTER — Other Ambulatory Visit: Payer: Self-pay

## 2024-06-30 ENCOUNTER — Other Ambulatory Visit: Payer: Self-pay

## 2024-07-14 ENCOUNTER — Other Ambulatory Visit: Payer: Self-pay

## 2024-09-01 ENCOUNTER — Ambulatory Visit: Admitting: Nurse Practitioner

## 2024-09-11 ENCOUNTER — Encounter: Payer: Self-pay | Admitting: Internal Medicine
